# Patient Record
Sex: Female | Born: 1955
Health system: Southern US, Community
[De-identification: ages and names within clinical notes are randomized; demographics above are authoritative.]

## PROBLEM LIST (undated history)

## (undated) DIAGNOSIS — L03012 Cellulitis of left finger: Secondary | ICD-10-CM

## (undated) DIAGNOSIS — H3550 Unspecified hereditary retinal dystrophy: Secondary | ICD-10-CM

## (undated) DIAGNOSIS — M159 Polyosteoarthritis, unspecified: Secondary | ICD-10-CM

## (undated) DIAGNOSIS — K219 Gastro-esophageal reflux disease without esophagitis: Secondary | ICD-10-CM

## (undated) DIAGNOSIS — R490 Dysphonia: Secondary | ICD-10-CM

## (undated) HISTORY — DX: Cellulitis of left finger: L03.012

## (undated) HISTORY — DX: Gastro-esophageal reflux disease without esophagitis: K21.9

## (undated) HISTORY — DX: Polyosteoarthritis, unspecified: M15.9

## (undated) HISTORY — DX: Unspecified hereditary retinal dystrophy: H35.50

## (undated) HISTORY — DX: Dysphonia: R49.0

## (undated) NOTE — Progress Notes (Signed)
Formatting of this note might be different from the original.  Patient presents today for Prolia injection.    Electronically signed by Diona Foley, CMA at 10/21/2022  9:47 AM EST

---

## 1999-01-07 ENCOUNTER — Ambulatory Visit (HOSPITAL_COMMUNITY): Admission: RE | Admit: 1999-01-07 | Discharge: 1999-01-07 | Payer: Self-pay

## 1999-03-02 ENCOUNTER — Ambulatory Visit (HOSPITAL_COMMUNITY): Admission: RE | Admit: 1999-03-02 | Discharge: 1999-03-02 | Payer: Self-pay

## 1999-10-05 HISTORY — PX: ABDOMINAL HYSTERECTOMY: SHX81

## 2000-01-20 ENCOUNTER — Ambulatory Visit (HOSPITAL_COMMUNITY): Admission: RE | Admit: 2000-01-20 | Discharge: 2000-01-20 | Payer: Self-pay | Admitting: Specialist

## 2000-01-20 ENCOUNTER — Encounter: Payer: Self-pay | Admitting: Specialist

## 2001-01-23 ENCOUNTER — Encounter: Payer: Self-pay | Admitting: Specialist

## 2001-01-23 ENCOUNTER — Ambulatory Visit (HOSPITAL_COMMUNITY): Admission: RE | Admit: 2001-01-23 | Discharge: 2001-01-23 | Payer: Self-pay | Admitting: Specialist

## 2002-01-25 ENCOUNTER — Ambulatory Visit (HOSPITAL_COMMUNITY): Admission: RE | Admit: 2002-01-25 | Discharge: 2002-01-25 | Payer: Self-pay | Admitting: Specialist

## 2002-01-25 ENCOUNTER — Encounter: Payer: Self-pay | Admitting: Specialist

## 2004-03-26 ENCOUNTER — Ambulatory Visit (HOSPITAL_COMMUNITY): Admission: RE | Admit: 2004-03-26 | Discharge: 2004-03-26 | Payer: Self-pay | Admitting: Specialist

## 2005-06-01 ENCOUNTER — Ambulatory Visit (HOSPITAL_COMMUNITY): Admission: RE | Admit: 2005-06-01 | Discharge: 2005-06-01 | Payer: Self-pay | Admitting: Specialist

## 2006-05-19 ENCOUNTER — Ambulatory Visit (HOSPITAL_COMMUNITY): Admission: RE | Admit: 2006-05-19 | Discharge: 2006-05-19 | Payer: Self-pay | Admitting: Gastroenterology

## 2006-05-19 ENCOUNTER — Encounter (INDEPENDENT_AMBULATORY_CARE_PROVIDER_SITE_OTHER): Payer: Self-pay | Admitting: Specialist

## 2006-07-06 ENCOUNTER — Ambulatory Visit (HOSPITAL_COMMUNITY): Admission: RE | Admit: 2006-07-06 | Discharge: 2006-07-06 | Payer: Self-pay | Admitting: Specialist

## 2006-09-08 ENCOUNTER — Ambulatory Visit: Payer: Self-pay | Admitting: Internal Medicine

## 2006-09-08 LAB — CONVERTED CEMR LAB
ALT: 20 units/L (ref 0–40)
AST: 19 units/L (ref 0–37)
Albumin: 4.3 g/dL (ref 3.5–5.2)
Alkaline Phosphatase: 63 units/L (ref 39–117)
BUN: 11 mg/dL (ref 6–23)
Basophils Absolute: 0 10*3/uL (ref 0.0–0.1)
Basophils Relative: 0.6 % (ref 0.0–1.0)
CO2: 29 meq/L (ref 19–32)
Calcium: 9.6 mg/dL (ref 8.4–10.5)
Chloride: 104 meq/L (ref 96–112)
Chol/HDL Ratio, serum: 3.1
Cholesterol: 208 mg/dL (ref 0–200)
Creatinine, Ser: 0.8 mg/dL (ref 0.4–1.2)
Eosinophil percent: 5.1 % — ABNORMAL HIGH (ref 0.0–5.0)
GFR calc non Af Amer: 80 mL/min
Glomerular Filtration Rate, Af Am: 97 mL/min/{1.73_m2}
Glucose, Bld: 82 mg/dL (ref 70–99)
HCT: 42.6 % (ref 36.0–46.0)
HDL: 66.4 mg/dL (ref >39.0)
Hemoglobin: 14.4 g/dL (ref 12.0–15.0)
LDL DIRECT: 134.3 mg/dL
Lymphocytes Relative: 33.2 % (ref 12.0–46.0)
MCHC: 33.9 g/dL (ref 30.0–36.0)
MCV: 88.2 fL (ref 78.0–100.0)
Monocytes Absolute: 0.4 10*3/uL (ref 0.2–0.7)
Monocytes Relative: 6 % (ref 3.0–11.0)
Neutro Abs: 3.9 10*3/uL (ref 1.4–7.7)
Neutrophils Relative %: 55.1 % (ref 43.0–77.0)
Platelets: 303 10*3/uL (ref 150–400)
Potassium: 4.1 meq/L (ref 3.5–5.1)
RBC: 4.83 M/uL (ref 3.87–5.11)
RDW: 11.9 % (ref 11.5–14.6)
Sodium: 142 meq/L (ref 135–145)
TSH: 2.85 microintl units/mL (ref 0.35–5.50)
Total Bilirubin: 0.9 mg/dL (ref 0.3–1.2)
Total Protein: 7.4 g/dL (ref 6.0–8.3)
Triglyceride fasting, serum: 37 mg/dL (ref 0–149)
VLDL: 7 mg/dL (ref 0–40)
WBC: 7 10*3/uL (ref 4.5–10.5)

## 2006-09-28 ENCOUNTER — Ambulatory Visit: Payer: Self-pay | Admitting: Internal Medicine

## 2006-11-10 ENCOUNTER — Ambulatory Visit: Payer: Self-pay | Admitting: Cardiology

## 2007-07-03 ENCOUNTER — Ambulatory Visit: Payer: Self-pay | Admitting: Internal Medicine

## 2007-08-11 ENCOUNTER — Ambulatory Visit (HOSPITAL_COMMUNITY): Admission: RE | Admit: 2007-08-11 | Discharge: 2007-08-11 | Payer: Self-pay | Admitting: Specialist

## 2008-01-10 ENCOUNTER — Ambulatory Visit: Payer: Self-pay | Admitting: Internal Medicine

## 2008-06-18 ENCOUNTER — Telehealth: Payer: Self-pay | Admitting: Internal Medicine

## 2008-06-19 ENCOUNTER — Encounter: Payer: Self-pay | Admitting: Internal Medicine

## 2008-11-29 ENCOUNTER — Emergency Department (HOSPITAL_COMMUNITY): Admission: EM | Admit: 2008-11-29 | Discharge: 2008-11-29 | Payer: Self-pay | Admitting: Emergency Medicine

## 2010-03-05 ENCOUNTER — Telehealth: Payer: Self-pay | Admitting: Internal Medicine

## 2010-03-06 ENCOUNTER — Ambulatory Visit: Payer: Self-pay | Admitting: Internal Medicine

## 2010-03-12 ENCOUNTER — Telehealth: Payer: Self-pay | Admitting: Internal Medicine

## 2010-06-02 ENCOUNTER — Ambulatory Visit (HOSPITAL_COMMUNITY)
Admission: RE | Admit: 2010-06-02 | Discharge: 2010-06-02 | Payer: Self-pay | Source: Home / Self Care | Admitting: Specialist

## 2010-10-09 ENCOUNTER — Ambulatory Visit: Payer: Self-pay | Admitting: Internal Medicine

## 2010-10-09 ENCOUNTER — Encounter: Payer: Self-pay | Admitting: Internal Medicine

## 2010-10-09 ENCOUNTER — Ambulatory Visit
Admission: RE | Admit: 2010-10-09 | Discharge: 2010-10-09 | Payer: Self-pay | Source: Home / Self Care | Attending: Internal Medicine | Admitting: Internal Medicine

## 2010-10-09 ENCOUNTER — Other Ambulatory Visit: Payer: Self-pay | Admitting: Internal Medicine

## 2010-10-09 LAB — CBC WITH DIFFERENTIAL/PLATELET
Basophils Absolute: 0 10*3/uL (ref 0.0–0.1)
Basophils Relative: 0.5 % (ref 0.0–3.0)
Eosinophils Absolute: 0.2 10*3/uL (ref 0.0–0.7)
Eosinophils Relative: 2.1 % (ref 0.0–5.0)
HCT: 41.8 % (ref 36.0–46.0)
Hemoglobin: 14.3 g/dL (ref 12.0–15.0)
Lymphocytes Relative: 31.8 % (ref 12.0–46.0)
Lymphs Abs: 2.4 10*3/uL (ref 0.7–4.0)
MCHC: 34.2 g/dL (ref 30.0–36.0)
MCV: 88.4 fl (ref 78.0–100.0)
Monocytes Absolute: 0.5 10*3/uL (ref 0.1–1.0)
Monocytes Relative: 6.3 % (ref 3.0–12.0)
Neutro Abs: 4.5 10*3/uL (ref 1.4–7.7)
Neutrophils Relative %: 59.3 % (ref 43.0–77.0)
Platelets: 328 10*3/uL (ref 150.0–400.0)
RBC: 4.73 Mil/uL (ref 3.87–5.11)
RDW: 13.1 % (ref 11.5–14.6)
WBC: 7.6 10*3/uL (ref 4.5–10.5)

## 2010-10-09 LAB — LIPID PANEL
Cholesterol: 197 mg/dL (ref 0–200)
HDL: 59.4 mg/dL (ref >39.00)
LDL Cholesterol: 128 mg/dL — ABNORMAL HIGH (ref 0–99)
Total CHOL/HDL Ratio: 3
Triglycerides: 50 mg/dL (ref 0.0–149.0)
VLDL: 10 mg/dL (ref 0.0–40.0)

## 2010-10-09 LAB — HEPATIC FUNCTION PANEL
ALT: 17 U/L (ref 0–35)
AST: 14 U/L (ref 0–37)
Albumin: 3.8 g/dL (ref 3.5–5.2)
Alkaline Phosphatase: 63 U/L (ref 39–117)
Bilirubin, Direct: 0.1 mg/dL (ref 0.0–0.3)
Total Bilirubin: 0.9 mg/dL (ref 0.3–1.2)
Total Protein: 6.5 g/dL (ref 6.0–8.3)

## 2010-10-22 ENCOUNTER — Ambulatory Visit (HOSPITAL_COMMUNITY): Admission: RE | Admit: 2010-10-22 | Discharge: 2010-10-22 | Payer: Self-pay | Source: Home / Self Care

## 2010-10-25 ENCOUNTER — Encounter: Payer: Self-pay | Admitting: Internal Medicine

## 2010-10-26 LAB — SURGICAL PCR SCREEN
MRSA, PCR: NEGATIVE
Staphylococcus aureus: NEGATIVE

## 2010-11-03 NOTE — Assessment & Plan Note (Signed)
Summary: TICK BITES/SWORDS PT/PS   Vital Signs:  Patient profile:   55 year old female Weight:      134 pounds Temp:     97.5 degrees F oral BP sitting:   110 / 78  (left arm) Cuff size:   regular  Vitals Entered By: Duard Brady LPN (March 06, 6282 11:30 AM) CC: c/o 3 tick bites  2 in (l0 groin area , 1 on buttocks Is Patient Diabetic? No   CC:  c/o 3 tick bites  2 in (l0 groin area  and 1 on buttocks.  History of Present Illness: 55 year old patient who [place] 5 [place] and removed three ticks two days ago from the groin and buttock regions.  She has had no fever, headache, myalgias, or other constitutional complaints.  There has been no skin rash and the puncture sites are healing well where  the tics were extracted.  Allergies (verified): No Known Drug Allergies  Physical Exam  General:  Well-developed,well-nourished,in no acute distress; alert,appropriate and cooperative throughout examination Skin:  3 small approximately 2 mm shallow erythematous ulcers involving the left groin and buttock area   Impression & Recommendations:  Problem # 1:  TICK BITE (ICD-E906.4) the patient will be clinically observed at this time.  She was told this he develops fever or flulike illness or rash over the next 6 weeks to contact the office immediately for empiric antibiotic therapy  Complete Medication List: 1)  Calcium 500/d 500-200 Mg-unit Tabs (Calcium carbonate-vitamin d) .Marland Kitchen.. 1000 mg. q day  Patient Instructions: 1)  Please schedule a follow-up appointment as needed. 2)  call if you develop fever, skin rash, or a flulike illness

## 2010-11-03 NOTE — Progress Notes (Signed)
Summary: tick bite complications  Phone Note Call from Patient   Caller: Patient Call For: Birdie Sons MD Summary of Call: Pt has joint pain, and headache, and wants to be treated.  [phone]  Found one more tick.  Joints are swollen.  Has not taken temp  [facility] Initial call taken by: Lynann Beaver CMA,  March 12, 2010 4:00 PM  Follow-up for Phone Call        called and left message  Rx doxycycline 100  #30 one twice daily Follow-up by: Gordy Savers  MD,  March 12, 2010 5:03 PM    New/Updated Medications: DOXYCYCLINE HYCLATE 100 MG CAPS (DOXYCYCLINE HYCLATE) one by mouth two times a day Prescriptions: DOXYCYCLINE HYCLATE 100 MG CAPS (DOXYCYCLINE HYCLATE) one by mouth two times a day  #30 x 0   Entered by:   Lynann Beaver CMA   Authorized by:   Gordy Savers  MD   Signed by:   Lynann Beaver CMA on 03/12/2010   Method used:   Electronically to        [facility]. [phone]* (retail)       [address]       [place], [place]  [address]       Ph: [phone]       Fax: [phone]   RxID:   [id number] DOXYCYCLINE HYCLATE 100 MG CAPS (DOXYCYCLINE HYCLATE) one by mouth two times a day  #30 x 0   Entered by:   Lynann Beaver CMA   Authorized by:   Gordy Savers  MD   Signed by:   Lynann Beaver CMA on 03/12/2010   Method used:   Electronically to        [facility]. [phone]* (retail)       [phone]       [place], [place]  [address]       Ph: [phone]       Fax: [phone]   RxID:   [id number]

## 2010-11-03 NOTE — Progress Notes (Signed)
Summary: wants rx for abx  Phone Note Call from Patient Call back at [phone]   Caller: Patient----voice mail Summary of Call: Has been bitten by a deer tick in 3 places with the head enbedded in her. She get the tick to release and has swollen areas. Requesting to be treated for Lyme disease. wants doxycycline for 2 weeks or so. She had some old pills at home so she started them  herself.  Initial call taken by: Warnell Forester,  March 05, 2010 3:43 PM  Follow-up for Phone Call        fyi- hasn't been seen since 2009- tried to call and make ov,but no answer - the 2009 visit was also for a tick bite Follow-up by: Willy Eddy, LPN,  March 05, 1190 4:50 PM  Additional Follow-up for Phone Call Additional follow up Details #1::        none of this is necessary, if concerned schedule OV Additional Follow-up by: Birdie Sons MD,  March 06, 2010 10:12 AM    Additional Follow-up for Phone Call Additional follow up Details #2::    Set ov workin with Dr. Kirtland Bouchard. Follow-up by: Rudy Jew, RN,  March 06, 2010 10:20 AM

## 2010-11-05 NOTE — Assessment & Plan Note (Signed)
Summary: PRE-OP / SURG CLEARANCE // RS/PT [facility] FROM [facility]   Vital Signs:  Patient profile:   55 year old female Height:      67.5 inches Weight:      134 pounds BMI:     20.75 Temp:     98.4 degrees F oral Pulse rate:   64 / minute Pulse rhythm:   regular BP sitting:   104 / 80  (left arm) Cuff size:   regular  Vitals Entered By: Alfred Levins, CMA (October 09, 2010 12:10 PM)  Serial Vital Signs/Assessments:  Time      Position  BP       Pulse  Resp  Temp     By                     110/62                         Alfred Levins, CMA  CC: surgical clearance, cosmetic surgery in February   CC:  surgical clearance and cosmetic surgery in February.  Preventive Screening-Counseling & Management  Alcohol-Tobacco     Smoking Status: never  Caffeine-Diet-Exercise     Does Patient Exercise: yes      Drug Use:  no.    Current Medications (verified): 1)  Calcium 500/d 500-200 Mg-Unit  Tabs (Calcium Carbonate-Vitamin D) .Marland Kitchen.. 1000 Mg. Q Day 2)  Fish Oil  Oil (Fish Oil) .... Once Daily 3)  Preserve Vision .... Once Daily  Allergies (verified): No Known Drug Allergies  Past History:  Past Medical History: Unremarkable  Past Surgical History: Hysterectomy 2001  Family History: Family History of Colon CA 1st degree relative <60 Family History Hypertension mother carotid cancer, hypertension, spinal stenosis, emphysema still alive Father deceased (diving accident) Paternal Grandfather colon cancer 2 sisters healthy  Social History: Married Never Smoked Alcohol use-no Drug use-no Regular exercise-yes Smoking Status:  never Drug Use:  no Does Patient Exercise:  yes  Physical Exam  General:   well-developed well-nourished female in no acute distress. HEENT exam atraumatic, normocephalic. Extraocular muscles are intact. Neck is supple without lymphadenopathy or thyromegaly. Chest clear to auscultation cardiac exam S1-S2 are regular. Abdominal exam abdomen is soft and  nontender. Extremities no edema. Neurologic exam alert without any motor or sensory deficits.   Impression & Recommendations:  Problem # 1:  PREOPERATIVE EXAMINATION (ICD-V72.84)  patient is here for preoperative clearance. She has no significant medical problems. I think the risk islower thanr age-matched controls. Her EKG is unremarkable. We'll obtain lab work. Orders: Venipuncture ([address]) Specimen Handling (60454) TLB-CBC Platelet - w/Differential (85025-CBCD) TLB-Lipid Panel (80061-LIPID) TLB-Hepatic/Liver Function Pnl (80076-HEPATIC)  Complete Medication List: 1)  Calcium 500/d 500-200 Mg-unit Tabs (Calcium carbonate-vitamin d) .Marland Kitchen.. 1000 mg. q day 2)  Fish Oil Oil (Fish oil) .... Once daily 3)  Preserve Vision  .... Once daily   Orders Added: 1)  Venipuncture [09811] 2)  Specimen Handling [99000] 3)  TLB-CBC Platelet - w/Differential [85025-CBCD] 4)  TLB-Lipid Panel [80061-LIPID] 5)  TLB-Hepatic/Liver Function Pnl [80076-HEPATIC] 6)  Est. Patient Level III [91478]

## 2011-01-05 ENCOUNTER — Telehealth: Payer: Self-pay | Admitting: *Deleted

## 2011-01-05 MED ORDER — DOXYCYCLINE HYCLATE 100 MG PO TABS: 100.0000 mg | ORAL_TABLET | Freq: Two times a day (BID) | ORAL | Status: AC

## 2011-01-05 NOTE — Telephone Encounter (Signed)
Pt has 3 deer tick bites, and would like a prescription for Doxycycline called to [facility]

## 2011-01-05 NOTE — Telephone Encounter (Signed)
Ok Doxycycline 100 mg po bid for 7 days

## 2011-01-05 NOTE — Telephone Encounter (Signed)
Pt.notified

## 2011-01-19 LAB — POCT URINALYSIS DIP (DEVICE)
Bilirubin Urine: NEGATIVE
Glucose, UA: NEGATIVE mg/dL
Ketones, ur: NEGATIVE mg/dL
Nitrite: NEGATIVE
Protein, ur: NEGATIVE mg/dL
Urobilinogen, UA: 0.2 mg/dL (ref 0.0–1.0)

## 2011-01-19 LAB — CBC
MCHC: 34.4 g/dL (ref 30.0–36.0)
MCV: 88.3 fL (ref 78.0–100.0)
Platelets: 225 10*3/uL (ref 150–400)
RBC: 4.53 MIL/uL (ref 3.87–5.11)
WBC: 6 10*3/uL (ref 4.0–10.5)

## 2011-01-19 LAB — DIFFERENTIAL
Eosinophils Absolute: 0.3 10*3/uL (ref 0.0–0.7)
Lymphs Abs: 2.4 10*3/uL (ref 0.7–4.0)
Neutro Abs: 2.7 10*3/uL (ref 1.7–7.7)

## 2011-02-19 NOTE — Op Note (Signed)
Robin Henry, Robin Henry                 ACCOUNT NO.:  [id number]   MEDICAL RECORD NO.:  [id number]          PATIENT TYPE:  AMB   LOCATION:  ENDO                         FACILITY:  [facility]   PHYSICIAN:  Petra Kuba, M.D.    DATE OF BIRTH:  04/04/1956   DATE OF PROCEDURE:  05/19/2006  DATE OF DISCHARGE:                                 OPERATIVE REPORT   PROCEDURE PERFORMED:  Esophagogastroduodenoscopy with biopsy.   ENDOSCOPIST:  Petra Kuba, M.D.   INDICATIONS FOR PROCEDURE:  Upper tract symptoms.   Consent was signed after the risks, benefits, methods and options were  thoroughly discussed before any premeds given with my nurse in the office.   MEDICINES USED:  Per anesthesia, Diprivan, fentanyl and Versed per patient  preference.   DESCRIPTION OF PROCEDURE:  The video endoscope was inserted by direct  vision.  The esophagus was normal.  She did have a tiny hiatal hernia.  Scope passed into the stomach and advanced through a normal antrum, normal  pylorus into a normal duodenal bulb and around the C-loop to a normal second  portion of the duodenum.  The scope was withdrawn back to the bulb and a  good look there confirmed its normal appearance.  Scope was withdrawn back  to the stomach and retroflexed.  The angularis, cardia and fundus, lesser  and greater curve were normal on retroflex visualization.  Straight  visualization of the stomach did not reveal any additional findings.  The  scope was reinserted through the pylorus and into the bulb and into the  second portion of the duodenum just to double check and no additional  findings were seen.  Scope was withdrawn back to the stomach, two biopsies  of the antrum, two of the proximal stomach were obtained to rule out  Helicobacter pylori.  Air was suctioned, scope was slowly withdrawn.  Again,  a good look at the esophagus was normal.  A good look at both vocal cords  were normal.  Scope was removed.  The patient tolerated the  procedure well.  There were no obvious immediate complication.   ENDOSCOPIC DIAGNOSIS:  1. Tiny hiatal hernia with normal-appearing esophagus.  2. Otherwise within normal limits esophagogastroduodenoscopy status post      stomach biopsies to rule out Helicobacter pylori.   PLAN:  Await pathology.  Follow-up p.r.n.  Pump inhibitors, H2 blockers or  antacids p.r.n.           ______________________________  Petra Kuba, M.D.     MEM/MEDQ  D:  05/19/2006  T:  05/19/2006  Job:  [id number]   cc:   Valetta Mole. Swords, MD  [address] [place]  [place] [address]

## 2011-02-19 NOTE — Op Note (Signed)
Robin Henry, Robin Henry                 ACCOUNT NO.:  [id number]   MEDICAL RECORD NO.:  [id number]          PATIENT TYPE:  AMB   LOCATION:  ENDO                         FACILITY:  [facility]   PHYSICIAN:  Petra Kuba, M.D.    DATE OF BIRTH:  1955/10/26   DATE OF PROCEDURE:  05/19/2006  DATE OF DISCHARGE:                                 OPERATIVE REPORT   PROCEDURE:  Colonoscopy.   INDICATIONS FOR PROCEDURE:  Family history of colon cancer in a grandparent  at a young age.  Due for colonic screening.  Informed consent was signed  after risks, benefits, methods, and options were thoroughly discussed prior  to any premedications given and with my nurse in the office.   MEDICATIONS USED:  Per anesthesia, Diprivan, Fentanyl, and Versed per  patient choice.   DESCRIPTION OF PROCEDURE:  Rectal inspection was pertinent for small  external hemorrhoids.  Digital exam was negative.  The video pediatric  adjustable colonoscope was inserted and displayed a somewhat tortuous  looping colon.  With some abdominal pressure, was able to be advanced to the  cecum.  No abnormalities were seen on insertion.  The cecum was identified  by the appendiceal orifice and the ileocecal valve.  The prep was adequate.  There was some liquid stool that required washing and suctioning.  On slow  withdrawal through the colon, no abnormalities were seen.  When we fell back  around a loop, we did readvance around the curve to decrease chances of  missing things.  Once back in the rectum, anorectal pull-through and  retroflexion confirmed some small hemorrhoids.  The scope was straightened  and readvanced a short ways up the left side of the colon.  Air was  suctioned and the scope removed.   The patient tolerated the procedure well.  There were no obvious immediate  complications.   ENDOSCOPIC DIAGNOSES:  1. Internal and external small hemorrhoids.  2. Somewhat tortuous long looping colon.  3. Otherwise within normal  limits to the cecum.   PLAN:  Recheck colon screening in five years.  Happy to see back p.r.n.  Continue workup with an EGD.           ______________________________  Petra Kuba, M.D.     MEM/MEDQ  D:  05/19/2006  T:  05/19/2006  Job:  [id number]   cc:   Valetta Mole. Swords, MD  [address] [place]  [place] [address]

## 2011-05-12 ENCOUNTER — Other Ambulatory Visit: Payer: Self-pay | Admitting: Internal Medicine

## 2011-05-12 DIAGNOSIS — M8580 Other specified disorders of bone density and structure, unspecified site: Secondary | ICD-10-CM

## 2011-05-12 DIAGNOSIS — Z1231 Encounter for screening mammogram for malignant neoplasm of breast: Secondary | ICD-10-CM

## 2011-06-08 ENCOUNTER — Ambulatory Visit (HOSPITAL_COMMUNITY)
Admission: RE | Admit: 2011-06-08 | Discharge: 2011-06-08 | Disposition: A | Discharge status: Home / Self Care | Payer: 59 | Source: Ambulatory Visit | Attending: Internal Medicine | Admitting: Internal Medicine

## 2011-06-08 ENCOUNTER — Other Ambulatory Visit (HOSPITAL_COMMUNITY): Payer: Self-pay | Admitting: Specialist

## 2011-06-08 DIAGNOSIS — M8580 Other specified disorders of bone density and structure, unspecified site: Secondary | ICD-10-CM

## 2011-06-08 DIAGNOSIS — Z1231 Encounter for screening mammogram for malignant neoplasm of breast: Secondary | ICD-10-CM | POA: Insufficient documentation

## 2011-06-22 ENCOUNTER — Encounter (INDEPENDENT_AMBULATORY_CARE_PROVIDER_SITE_OTHER): Payer: Self-pay | Admitting: Ophthalmology

## 2012-01-10 ENCOUNTER — Telehealth: Payer: Self-pay | Admitting: Internal Medicine

## 2012-01-10 NOTE — Telephone Encounter (Signed)
Pt was bit by ticks this weekend and is not having joint pain and would like an rx for Doxycycline call in to [facility]. Pt said you can call her back and leave a detailed message on cell phone

## 2012-01-10 NOTE — Telephone Encounter (Signed)
Not necessary unless she develops a rash or infection around the site

## 2012-01-10 NOTE — Telephone Encounter (Signed)
Arline Asp, can you take a look at this please? Thanks.

## 2012-01-10 NOTE — Telephone Encounter (Signed)
Left message for pt to call back  °

## 2012-01-10 NOTE — Telephone Encounter (Signed)
Correction pt is having joint pain. Arline Asp please call pt today at [phone]

## 2012-01-11 MED ORDER — DOXYCYCLINE HYCLATE 100 MG PO TABS: 100.0000 mg | ORAL_TABLET | Freq: Two times a day (BID) | ORAL | Status: AC

## 2012-01-11 NOTE — Telephone Encounter (Signed)
Per pt she has a rash on back and 4 bites.  Per Dr Lovell Sheehan ok to call in doxycycline 100 mg bid #42, called into [facility] per pt request

## 2012-01-11 NOTE — Telephone Encounter (Signed)
Left message for pt to call back  °

## 2012-06-12 ENCOUNTER — Encounter: Payer: Self-pay | Admitting: Internal Medicine

## 2012-06-12 ENCOUNTER — Ambulatory Visit (INDEPENDENT_AMBULATORY_CARE_PROVIDER_SITE_OTHER): Payer: 59 | Admitting: Internal Medicine

## 2012-06-12 VITALS — BP 104/80 | HR 54 | Temp 97.6°F | Wt 135.0 lb

## 2012-06-12 DIAGNOSIS — Z0000 Encounter for general adult medical examination without abnormal findings: Secondary | ICD-10-CM

## 2012-06-12 LAB — BASIC METABOLIC PANEL
BUN: 14 mg/dL (ref 6–23)
Calcium: 9.4 mg/dL (ref 8.4–10.5)
Chloride: 106 mEq/L (ref 96–112)
Creatinine, Ser: 0.6 mg/dL (ref 0.4–1.2)
GFR: 107.32 mL/min (ref >60.00)
Glucose, Bld: 97 mg/dL (ref 70–99)

## 2012-06-12 LAB — LIPID PANEL
HDL: 72.4 mg/dL (ref >39.00)
LDL Cholesterol: 110 mg/dL — ABNORMAL HIGH (ref 0–99)
Total CHOL/HDL Ratio: 3
VLDL: 8.4 mg/dL (ref 0.0–40.0)

## 2012-06-12 LAB — POCT URINALYSIS DIPSTICK
Bilirubin, UA: NEGATIVE
Leukocytes, UA: NEGATIVE
Urobilinogen, UA: 0.2
pH, UA: 5.5

## 2012-06-12 LAB — HEPATIC FUNCTION PANEL
AST: 18 U/L (ref 0–37)
Total Protein: 7 g/dL (ref 6.0–8.3)

## 2012-06-12 LAB — CBC WITH DIFFERENTIAL/PLATELET
Basophils Absolute: 0.1 10*3/uL (ref 0.0–0.1)
Basophils Relative: 0.8 % (ref 0.0–3.0)
HCT: 41 % (ref 36.0–46.0)
Lymphocytes Relative: 27.7 % (ref 12.0–46.0)
Lymphs Abs: 1.8 10*3/uL (ref 0.7–4.0)
MCV: 89.7 fl (ref 78.0–100.0)
Neutro Abs: 4.1 10*3/uL (ref 1.4–7.7)
Neutrophils Relative %: 63.4 % (ref 43.0–77.0)

## 2012-06-12 NOTE — Progress Notes (Signed)
Patient ID: Robin Henry, female   DOB: [birth year], 56 y.o.   MRN: [id number]  CPX  No past medical history on file.  History   Social History  . Marital Status: Married    Spouse Name: N/A    Number of Children: N/A  . Years of Education: N/A   Occupational History  . Not on file.   Social History Main Topics  . Smoking status: Never Smoker   . Smokeless tobacco: Not on file  . Alcohol Use: No  . Drug Use: No  . Sexually Active:    Other Topics Concern  . Not on file   Social History Narrative  . No narrative on file    Past Surgical History  Procedure Date  . Abdominal hysterectomy     Family History  Problem Relation Age of Onset  . Hypertension Mother   . Emphysema Mother   . Cancer Mother     cartoid  . Colon cancer Paternal Grandfather     No Known Allergies  Current Outpatient Prescriptions on File Prior to Visit  Medication Sig Dispense Refill  . calcium-vitamin D 500 MG tablet Take 1 tablet by mouth daily.        . fish oil-omega-3 fatty acids 1000 MG capsule Take 2 g by mouth daily.        . Multiple Vitamins-Minerals (VISION-VITE PRESERVE PO) Take by mouth daily.           patient denies chest pain, shortness of breath, orthopnea. Denies lower extremity edema, abdominal pain, change in appetite, change in bowel movements. Patient denies rashes, musculoskeletal complaints. No other specific complaints in a complete review of systems.   BP 104/80  Pulse 54  Temp 97.6 F (36.4 C) (Oral)  Wt 135 lb (61.236 kg)  Well-developed well-nourished female in no acute distress. HEENT exam atraumatic, normocephalic, extraocular muscles are intact. Neck is supple. No jugular venous distention no thyromegaly. Chest clear to auscultation without increased work of breathing. Cardiac exam S1 and S2 are regular. Abdominal exam active bowel sounds, soft, nontender. Extremities no edema. Neurologic exam she is alert without any motor sensory deficits. Gait is  normal.   A/P-- well visit - helath maintenance UTD

## 2012-06-28 ENCOUNTER — Other Ambulatory Visit (HOSPITAL_COMMUNITY): Payer: Self-pay | Admitting: Specialist

## 2012-06-28 DIAGNOSIS — Z1231 Encounter for screening mammogram for malignant neoplasm of breast: Secondary | ICD-10-CM

## 2012-07-10 ENCOUNTER — Ambulatory Visit (HOSPITAL_COMMUNITY)
Admission: RE | Admit: 2012-07-10 | Discharge: 2012-07-10 | Disposition: A | Discharge status: Home / Self Care | Payer: 59 | Source: Ambulatory Visit | Attending: Specialist | Admitting: Specialist

## 2012-07-10 DIAGNOSIS — Z1231 Encounter for screening mammogram for malignant neoplasm of breast: Secondary | ICD-10-CM | POA: Insufficient documentation

## 2012-09-08 ENCOUNTER — Encounter (HOSPITAL_COMMUNITY): Payer: Self-pay

## 2012-09-08 ENCOUNTER — Encounter (HOSPITAL_COMMUNITY)
Admission: RE | Disposition: A | Discharge status: Home / Self Care | Payer: Self-pay | Source: Ambulatory Visit | Attending: Gastroenterology

## 2012-09-08 ENCOUNTER — Ambulatory Visit (HOSPITAL_COMMUNITY)
Admission: RE | Admit: 2012-09-08 | Discharge: 2012-09-08 | Disposition: A | Discharge status: Home / Self Care | Payer: 59 | Source: Ambulatory Visit | Attending: Gastroenterology | Admitting: Gastroenterology

## 2012-09-08 DIAGNOSIS — Z1211 Encounter for screening for malignant neoplasm of colon: Secondary | ICD-10-CM | POA: Insufficient documentation

## 2012-09-08 DIAGNOSIS — K648 Other hemorrhoids: Secondary | ICD-10-CM | POA: Insufficient documentation

## 2012-09-08 DIAGNOSIS — Z800 Family history of malignant neoplasm of digestive organs: Secondary | ICD-10-CM | POA: Insufficient documentation

## 2012-09-08 DIAGNOSIS — K644 Residual hemorrhoidal skin tags: Secondary | ICD-10-CM | POA: Insufficient documentation

## 2012-09-08 DIAGNOSIS — K6389 Other specified diseases of intestine: Secondary | ICD-10-CM | POA: Insufficient documentation

## 2012-09-08 HISTORY — PX: COLONOSCOPY: SHX5424

## 2012-09-08 SURGERY — COLONOSCOPY

## 2012-09-08 MED ORDER — MIDAZOLAM HCL 10 MG/2ML IJ SOLN
INTRAMUSCULAR | Status: AC
  Filled 2012-09-08: qty 2

## 2012-09-08 MED ORDER — SODIUM CHLORIDE 0.9 % IV SOLN: INTRAVENOUS | Status: DC

## 2012-09-08 MED ORDER — FENTANYL CITRATE 0.05 MG/ML IJ SOLN
INTRAMUSCULAR | Status: AC
  Filled 2012-09-08: qty 2

## 2012-09-08 NOTE — Progress Notes (Signed)
Robin Henry 11:11 AM  Subjective: Patient without any GI problems except occasional mucoid bowel and her history was reviewed and no different from when she was in our office and no new meds  Objective: Vital signs stable afebrile no acute distress exam please see pre-assessment evaluation  Assessment: Family history of colon cancer  Plan: Okay to proceed with colonoscopy  [facility]

## 2012-09-08 NOTE — Op Note (Signed)
[facility] [address] [place], [address]   COLONOSCOPY PROCEDURE REPORT  PATIENT: Robin Henry, Robin Henry  MR#: [id number] BIRTHDATE: May 18, 1956 , 56  yrs. old GENDER: Female ENDOSCOPIST: Vida Rigger, MD REFERRED XB:JYNWG Swords, M.D. PROCEDURE DATE:  09/08/2012 PROCEDURE:   Colonoscopy, screening ASA CLASS:   Class I INDICATIONS:Colorectal cancer screening.  family history of colon cancer in a grandparent MEDICATIONS: None  DESCRIPTION OF PROCEDURE:   After the risks benefits and alternatives of the procedure were thoroughly explained, informed consent was obtained.  The Pentax Ped Colon [id number]  endoscope was introduced through the anus and advanced to the terminal ileum which was intubated for a short distance , limited by No adverse events experienced.   The quality of the prep was adequate. .  The instrument was then slowly withdrawn as the colon was fully examined. to advanced to the cecum required abdominal pressure but no position changes and no abnormalities was seen on insertion . The scope was inserted a short ways into the terminal ileum and a few erosions were seen without surrounding inflammation in the [place]. The scope was slowly withdrawn the colon was completely normal once back in the rectum and anal rectal pull-through and retroflexion confirmed some small hemorrhoids the scope was straightened readvanced a  short ways up the left side of the colon air was suctioned scope removed patient tolerated the procedure well there was no obvious immediate complication      FINDINGS:  1 internal/external small hemorrhoids 2. Few terminal ileum erosions without surrounding inflammation 3. Otherwise within normal limits  colonoscopy  COMPLICATIONS:none  IMPRESSION:  above  RECOMMENDATIONS: recheck colon screening in 5 years happy to see back sooner when necessary and we discussed doubtful Crohn's disease versus nonsteroidal-induced with her mother  with a history of colitis happy to see back if symptoms increase   _______________________________ eSigned:  Vida Rigger, MD 09/08/2012 11:53 AM   NF:AOZHY Romilda Garret, MD

## 2012-09-11 ENCOUNTER — Encounter (HOSPITAL_COMMUNITY): Payer: Self-pay | Admitting: Gastroenterology

## 2013-05-10 ENCOUNTER — Ambulatory Visit: Payer: 59

## 2013-07-02 ENCOUNTER — Other Ambulatory Visit (HOSPITAL_COMMUNITY): Payer: Self-pay | Admitting: Specialist

## 2013-07-02 DIAGNOSIS — Z1231 Encounter for screening mammogram for malignant neoplasm of breast: Secondary | ICD-10-CM

## 2013-07-19 ENCOUNTER — Ambulatory Visit (HOSPITAL_COMMUNITY)
Admission: RE | Admit: 2013-07-19 | Discharge: 2013-07-19 | Disposition: A | Discharge status: Home / Self Care | Payer: 59 | Source: Ambulatory Visit | Attending: Specialist | Admitting: Specialist

## 2013-07-19 DIAGNOSIS — Z1231 Encounter for screening mammogram for malignant neoplasm of breast: Secondary | ICD-10-CM | POA: Insufficient documentation

## 2013-07-25 ENCOUNTER — Other Ambulatory Visit: Payer: Self-pay | Admitting: Specialist

## 2013-07-25 DIAGNOSIS — R928 Other abnormal and inconclusive findings on diagnostic imaging of breast: Secondary | ICD-10-CM

## 2013-08-24 ENCOUNTER — Ambulatory Visit
Admission: RE | Admit: 2013-08-24 | Discharge: 2013-08-24 | Disposition: A | Discharge status: Home / Self Care | Payer: 59 | Source: Ambulatory Visit | Attending: Specialist | Admitting: Specialist

## 2013-08-24 DIAGNOSIS — R928 Other abnormal and inconclusive findings on diagnostic imaging of breast: Secondary | ICD-10-CM

## 2013-09-12 ENCOUNTER — Other Ambulatory Visit (INDEPENDENT_AMBULATORY_CARE_PROVIDER_SITE_OTHER): Payer: 59

## 2013-09-12 DIAGNOSIS — Z0000 Encounter for general adult medical examination without abnormal findings: Secondary | ICD-10-CM

## 2013-09-12 LAB — POCT URINALYSIS DIPSTICK
Bilirubin, UA: NEGATIVE
Ketones, UA: NEGATIVE
Leukocytes, UA: NEGATIVE
Nitrite, UA: NEGATIVE

## 2013-09-12 LAB — BASIC METABOLIC PANEL
CO2: 27 mEq/L (ref 19–32)
Chloride: 106 mEq/L (ref 96–112)
Creatinine, Ser: 0.7 mg/dL (ref 0.4–1.2)
Sodium: 143 mEq/L (ref 135–145)

## 2013-09-12 LAB — CBC WITH DIFFERENTIAL/PLATELET
Basophils Relative: 0.9 % (ref 0.0–3.0)
Eosinophils Absolute: 0.2 10*3/uL (ref 0.0–0.7)
HCT: 41.3 % (ref 36.0–46.0)
Hemoglobin: 13.6 g/dL (ref 12.0–15.0)
Lymphs Abs: 1.8 10*3/uL (ref 0.7–4.0)
MCHC: 33 g/dL (ref 30.0–36.0)
MCV: 87.8 fl (ref 78.0–100.0)
Monocytes Absolute: 0.4 10*3/uL (ref 0.1–1.0)
Neutro Abs: 3.4 10*3/uL (ref 1.4–7.7)
RBC: 4.71 Mil/uL (ref 3.87–5.11)

## 2013-09-12 LAB — HEPATIC FUNCTION PANEL
Albumin: 4.2 g/dL (ref 3.5–5.2)
Alkaline Phosphatase: 61 U/L (ref 39–117)
Bilirubin, Direct: 0.1 mg/dL (ref 0.0–0.3)

## 2013-09-12 LAB — TSH: TSH: 2.1 u[IU]/mL (ref 0.35–5.50)

## 2013-09-18 ENCOUNTER — Ambulatory Visit (INDEPENDENT_AMBULATORY_CARE_PROVIDER_SITE_OTHER): Payer: 59 | Admitting: Family

## 2013-09-18 ENCOUNTER — Encounter: Payer: Self-pay | Admitting: Family

## 2013-09-18 VITALS — BP 102/70 | HR 67 | Ht 66.5 in | Wt 135.4 lb

## 2013-09-18 DIAGNOSIS — Z0000 Encounter for general adult medical examination without abnormal findings: Secondary | ICD-10-CM

## 2013-09-18 NOTE — Progress Notes (Signed)
Subjective:    Patient ID: Robin Henry, female    DOB: May 03, 1956, 57 y.o.   MRN: [id number]  HPI 57 year old, nonsmoker, is in today for a routine physical examination for this healthy  Female. Reviewed all health maintenance protocols including mammography colonoscopy bone density and reviewed appropriate screening labs. Her immunization history was reviewed as well as her current medications and allergies refills of her chronic medications were given and the plan for yearly health maintenance was discussed all orders and referrals were made as appropriate.   Review of Systems  Constitutional: Negative.   HENT: Negative.   Eyes: Negative.   Respiratory: Negative.   Cardiovascular: Negative.   Gastrointestinal: Negative.   Endocrine: Negative.   Genitourinary: Negative.   Musculoskeletal: Negative.   Skin: Negative.   Allergic/Immunologic: Negative.   Neurological: Negative.   Hematological: Negative.   Psychiatric/Behavioral: Negative.    History reviewed. No pertinent past medical history.  History   Social History  . Marital Status: Single    Spouse Name: N/A    Number of Children: N/A  . Years of Education: N/A   Occupational History  . Not on file.   Social History Main Topics  . Smoking status: Never Smoker   . Smokeless tobacco: Not on file  . Alcohol Use: No  . Drug Use: No  . Sexual Activity:    Other Topics Concern  . Not on file   Social History Narrative  . No narrative on file    Past Surgical History  Procedure Laterality Date  . Abdominal hysterectomy    . Colonoscopy  09/08/2012    Procedure: COLONOSCOPY;  Surgeon: Petra Kuba, MD;  Location: [facility] ENDOSCOPY;  Service: Endoscopy;  Laterality: N/A;  wants no sedation-but start iv    Family History  Problem Relation Age of Onset  . Hypertension Mother   . Emphysema Mother   . Cancer Mother     cartoid  . Colon cancer Paternal Grandfather     No Known Allergies  Current Outpatient  Prescriptions on File Prior to Visit  Medication Sig Dispense Refill  . calcium-vitamin D 500 MG tablet Take 1 tablet by mouth daily.        . fish oil-omega-3 fatty acids 1000 MG capsule Take 2 g by mouth daily.        . Multiple Vitamins-Minerals (VISION-VITE PRESERVE PO) Take by mouth daily.         No current facility-administered medications on file prior to visit.    BP 102/70  Pulse 67  Ht 5' 6.5" (1.689 m)  Wt 135 lb 6.4 oz (61.417 kg)  BMI 21.53 kg/m2chart    Objective:   Physical Exam  Constitutional: She is oriented to person, place, and time. She appears well-developed and well-nourished.  HENT:  Head: Normocephalic.  Right Ear: External ear normal.  Left Ear: External ear normal.  Nose: Nose normal.  Mouth/Throat: Oropharynx is clear and moist.  Eyes: Conjunctivae and EOM are normal. Pupils are equal, round, and reactive to light.  Neck: Normal range of motion. Neck supple. No thyromegaly present.  Cardiovascular: Normal rate, regular rhythm and normal heart sounds.   Pulmonary/Chest: Effort normal and breath sounds normal.  Abdominal: Soft. Bowel sounds are normal. She exhibits no distension. There is no tenderness. There is no rebound.  Musculoskeletal: Normal range of motion. She exhibits no edema and no tenderness.  Neurological: She is alert and oriented to person, place, and time. She has  normal reflexes. She displays normal reflexes. No cranial nerve deficit. Coordination normal.  Skin: Skin is warm and dry.  Psychiatric: She has a normal mood and affect.          Assessment & Plan:  Assessment:  1. CPX  Plan: Encouraged healthy diet, exercise, monthly self breast exams. Encourage tetanus. Patient declines. Will return in a more convenient time for her to have a tetanus administered. Recheck in one year and sooner as needed.

## 2013-09-18 NOTE — Patient Instructions (Signed)

## 2014-05-09 ENCOUNTER — Telehealth: Payer: Self-pay | Admitting: Internal Medicine

## 2014-05-09 NOTE — Telephone Encounter (Signed)
Noted  

## 2014-05-09 NOTE — Telephone Encounter (Signed)
Patient Information:  Caller Name: Jnai  Phone: [phone]  Patient: Robin Henry  Gender: Female  DOB: 1955-11-10  Age: 58 Years  PCP: Phoebe Sharps (Adults only, leaving end of July 2015)  Office Follow Up:  Does the office need to follow up with this patient?: No  Instructions For The Office: N/A  RN Note:  Chigger Bites, onset 8-2.  Pt has severe itching w/ redness spreading around Bite areas.  All emergent sxs ruled out per Insect Bite protocol, see today d/t red or very tender area getting larger over 48 hrs after bite.  No same day appts w/ local [place] locations.  Pt doesn't wish to travel long distance, is currently at Urgent Care, will be evaluated now and will f/u w/ Office to set up well visit w/ a new MD, d/t Pt's PCP, Dr Leanne Chang.  Symptoms  Reason For Call & Symptoms: Chigger Bites, redness spreading, onset 8-2  Reviewed Health History In EMR: Yes  Reviewed Medications In EMR: Yes  Reviewed Allergies In EMR: Yes  Reviewed Surgeries / Procedures: Yes  Date of Onset of Symptoms: 05/05/2014  Treatments Tried: Hyrdocortizone 1%  Treatments Tried Worked: No  Guideline(s) Used:  Conservator, museum/gallery  Disposition Per Guideline:   See Today in Office  Reason For Disposition Reached:   Red or very tender (to touch) area, getting larger over 48 hours after the bite  Advice Given:  N/A  Patient Will Follow Care Advice:  YES

## 2015-03-28 ENCOUNTER — Telehealth: Payer: Self-pay | Admitting: Internal Medicine

## 2015-03-28 NOTE — Telephone Encounter (Signed)
Pt would like to est with dr Burnice Logan. Can I sch?

## 2015-03-28 NOTE — Telephone Encounter (Signed)
lmom for pt to call back

## 2015-03-28 NOTE — Telephone Encounter (Signed)
Dr. Raliegh Ip is not taking new patients, she will have to establish with another provider.

## 2015-03-31 ENCOUNTER — Other Ambulatory Visit (HOSPITAL_COMMUNITY): Payer: Self-pay | Admitting: Specialist

## 2015-03-31 DIAGNOSIS — Z1231 Encounter for screening mammogram for malignant neoplasm of breast: Secondary | ICD-10-CM

## 2015-04-02 NOTE — Telephone Encounter (Signed)
Pt aware of Dr Raliegh Ip 's decision. Advised pt [place] is near her home and she was very excited.  Transferred pt to [facility] office to est w/ Dr Anitra Lauth.

## 2015-04-02 NOTE — Telephone Encounter (Signed)
lmom for pt to call back

## 2015-04-11 ENCOUNTER — Ambulatory Visit (HOSPITAL_COMMUNITY)
Admission: RE | Admit: 2015-04-11 | Discharge: 2015-04-11 | Disposition: A | Discharge status: Home / Self Care | Payer: 59 | Source: Ambulatory Visit | Attending: Specialist | Admitting: Specialist

## 2015-04-11 DIAGNOSIS — Z1231 Encounter for screening mammogram for malignant neoplasm of breast: Secondary | ICD-10-CM | POA: Diagnosis not present

## 2015-04-25 ENCOUNTER — Ambulatory Visit (INDEPENDENT_AMBULATORY_CARE_PROVIDER_SITE_OTHER): Payer: 59 | Admitting: Family Medicine

## 2015-04-25 ENCOUNTER — Encounter: Payer: Self-pay | Admitting: Family Medicine

## 2015-04-25 VITALS — BP 114/81 | HR 69 | Temp 97.3°F | Resp 16 | Ht 67.0 in | Wt 134.0 lb

## 2015-04-25 DIAGNOSIS — Z23 Encounter for immunization: Secondary | ICD-10-CM

## 2015-04-25 DIAGNOSIS — Z0000 Encounter for general adult medical examination without abnormal findings: Secondary | ICD-10-CM

## 2015-04-25 DIAGNOSIS — E039 Hypothyroidism, unspecified: Secondary | ICD-10-CM

## 2015-04-25 DIAGNOSIS — E038 Other specified hypothyroidism: Secondary | ICD-10-CM

## 2015-04-25 LAB — COMPREHENSIVE METABOLIC PANEL
ALBUMIN: 4.4 g/dL (ref 3.5–5.2)
ALT: 19 U/L (ref 0–35)
AST: 16 U/L (ref 0–37)
Alkaline Phosphatase: 73 U/L (ref 39–117)
BILIRUBIN TOTAL: 0.9 mg/dL (ref 0.2–1.2)
BUN: 13 mg/dL (ref 6–23)
CALCIUM: 9.6 mg/dL (ref 8.4–10.5)
CHLORIDE: 104 meq/L (ref 96–112)
CO2: 30 mEq/L (ref 19–32)
CREATININE: 0.74 mg/dL (ref 0.40–1.20)
GFR: 85.03 mL/min (ref >60.00)
GLUCOSE: 77 mg/dL (ref 70–99)
Potassium: 4.5 mEq/L (ref 3.5–5.1)
SODIUM: 142 meq/L (ref 135–145)
TOTAL PROTEIN: 6.7 g/dL (ref 6.0–8.3)

## 2015-04-25 LAB — CBC WITH DIFFERENTIAL/PLATELET
BASOS ABS: 0 10*3/uL (ref 0.0–0.1)
Basophils Relative: 0.5 % (ref 0.0–3.0)
EOS ABS: 0.2 10*3/uL (ref 0.0–0.7)
EOS PCT: 2.6 % (ref 0.0–5.0)
HEMATOCRIT: 43.3 % (ref 36.0–46.0)
Hemoglobin: 14.2 g/dL (ref 12.0–15.0)
LYMPHS ABS: 1.8 10*3/uL (ref 0.7–4.0)
LYMPHS PCT: 26.8 % (ref 12.0–46.0)
MCHC: 32.8 g/dL (ref 30.0–36.0)
MCV: 87.3 fl (ref 78.0–100.0)
MONOS PCT: 6.2 % (ref 3.0–12.0)
Monocytes Absolute: 0.4 10*3/uL (ref 0.1–1.0)
NEUTROS ABS: 4.2 10*3/uL (ref 1.4–7.7)
Neutrophils Relative %: 63.9 % (ref 43.0–77.0)
PLATELETS: 277 10*3/uL (ref 150.0–400.0)
RBC: 4.96 Mil/uL (ref 3.87–5.11)
RDW: 13.6 % (ref 11.5–15.5)
WBC: 6.6 10*3/uL (ref 4.0–10.5)

## 2015-04-25 LAB — LIPID PANEL
CHOL/HDL RATIO: 3
Cholesterol: 210 mg/dL — ABNORMAL HIGH (ref 0–200)
HDL: 61.2 mg/dL (ref >39.00)
LDL CALC: 134 mg/dL — AB (ref 0–99)
NONHDL: 148.8
TRIGLYCERIDES: 76 mg/dL (ref 0.0–149.0)
VLDL: 15.2 mg/dL (ref 0.0–40.0)

## 2015-04-25 LAB — TSH: TSH: 2.56 u[IU]/mL (ref 0.35–4.50)

## 2015-04-25 MED ORDER — ZOSTER VACCINE LIVE 19400 UNT/0.65ML ~~LOC~~ SOLR: 0.6500 mL | Freq: Once | SUBCUTANEOUS | Status: DC

## 2015-04-25 NOTE — Progress Notes (Signed)
Office Note 04/26/2015  CC:  Chief Complaint  Patient presents with  . Establish Care    HPI:  Robin Henry is a 59 y.o. White female who is here to establish care. Patient's most recent primary MD: Dr. Leanne Chang (retired from clinical practice). Has a GYN MD and is getting ready to go see him soon. Old records in EPIC/HL were reviewed prior to or during today's visit.  Has had intermittent L LQ sharp pain that resolved in < 1 day. Some chronic joint aches, varies in location but most of the time it is her knees.  Does lots of sporting activities that put stress on joints.    Reports historically being in excellent health, no signif hx of problems at all.   Past Medical History  Diagnosis Date  . Osteoarthritis, multiple sites     Primarily knees  . Subclinical hypothyroidism     Past Surgical History  Procedure Laterality Date  . Abdominal hysterectomy  2001    fibroids  . Colonoscopy  09/08/2012    Normal.  Recall 10 yrs.  Procedure: COLONOSCOPY;  Surgeon: Jeryl Columbia, MD;  Location: [facility];  Service: Endoscopy;  Laterality: N/A;  wants no sedation-but start iv    Family History  Problem Relation Age of Onset  . Hypertension Mother   . Emphysema Mother   . Cancer Mother     cartoid  . Colon cancer Paternal Grandfather   . Alcohol abuse Sister   . Alcohol abuse Maternal Grandmother   . Lung cancer Maternal Grandmother     History   Social History  . Marital Status: Single    Spouse Name: N/A  . Number of Children: N/A  . Years of Education: N/A   Occupational History  . Not on file.   Social History Main Topics  . Smoking status: Never Smoker   . Smokeless tobacco: Not on file  . Alcohol Use: Yes     Comment: occasionally  . Drug Use: No  . Sexual Activity: Not on file   Other Topics Concern  . Not on file   Social History Narrative   Widow.   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 calcium-vitamin D 500 MG tablet Take 1 tablet by mouth daily.    . fish oil-omega-3 fatty acids 1000 MG capsule Take 2 g by mouth daily.    . Multiple Vitamins-Minerals (VISION-VITE PRESERVE PO) Take by mouth daily.    Marland Kitchen zoster vaccine live, PF, (ZOSTAVAX) 60630 UNT/0.65ML injection Inject 19,400 Units into the skin once.   No facility-administered encounter medications on file as of 04/25/2015.    No Known Allergies  ROS Review of Systems  Constitutional: Negative for fever, chills, appetite change and fatigue.  HENT: Negative for congestion, dental problem, ear pain and sore throat.   Eyes: Negative for discharge, redness and visual disturbance.  Respiratory: Negative for cough, chest tightness, shortness of breath and wheezing.   Cardiovascular: Negative for chest pain, palpitations and leg swelling.  Gastrointestinal: Negative for nausea, vomiting, abdominal pain, diarrhea and blood in stool.  Genitourinary: Negative for dysuria, urgency, frequency, hematuria, flank pain and difficulty urinating.  Musculoskeletal: Negative for myalgias, back pain, joint swelling, arthralgias and neck stiffness.  Skin: Negative for pallor and rash.  Neurological: Negative for dizziness, speech difficulty, weakness and headaches.  Hematological: Negative for adenopathy. Does not bruise/bleed easily.  Psychiatric/Behavioral: Negative for confusion and sleep disturbance. The patient is not nervous/anxious.     PE; Blood pressure 114/81, pulse 69, temperature 97.3 F (36.3 C), temperature source Oral, resp. rate 16, height 5\' 7"  (1.702 m), weight 134 lb (60.782 kg), SpO2 97 %.  Pt examined with Sharen Hones, CMA, as chaperone. Gen: Alert, well appearing.  Patient is oriented to person, place, time, and situation. AFFECT: pleasant, lucid thought and speech. ENT: Ears: EACs clear, normal epithelium.  TMs with good light reflex and landmarks bilaterally.  Eyes: no  injection, icteris, swelling, or exudate.  EOMI, PERRLA. Nose: no drainage or turbinate edema/swelling.  No injection or focal lesion.  Mouth: lips without lesion/swelling.  Oral mucosa pink and moist.  Dentition intact and without obvious caries or gingival swelling.  Oropharynx without erythema, exudate, or swelling.  Neck: supple/nontender.  No LAD, mass, or TM.  Carotid pulses 2+ bilaterally, without bruits. CV: RRR, no m/r/g.   LUNGS: CTA bilat, nonlabored resps, good aeration in all lung fields. ABD: soft, NT, ND, BS normal.  No hepatospenomegaly or mass.  No bruits. EXT: no clubbing, cyanosis, or edema.  Musculoskeletal: no joint swelling, erythema, warmth, or tenderness.  ROM of all joints intact. Skin - no sores or suspicious lesions or rashes or color changes  Pertinent labs:  none   ASSESSMENT AND PLAN:   Health maintenance exam: Reviewed age and gender appropriate health maintenance issues (prudent diet, regular exercise, health risks of tobacco and excessive alcohol, use of seatbelts, fire alarms in home, use of sunscreen).  Also reviewed age and gender appropriate health screening as well as vaccine recommendations. GYN: cerv and breast ca screening via GYN. HP labs done today, zostavax rx printed for pt.  An After Visit Summary was printed and given to the patient.   Return in about 1 year (around 04/24/2016) for annual CPE (fasting).

## 2015-04-25 NOTE — Progress Notes (Signed)
Pre visit review using our clinic review tool, if applicable. No additional management support is needed unless otherwise documented below in the visit note. 

## 2015-04-26 ENCOUNTER — Encounter: Payer: Self-pay | Admitting: Family Medicine

## 2015-10-28 ENCOUNTER — Telehealth: Payer: Self-pay | Admitting: *Deleted

## 2015-10-28 MED ORDER — ZOSTER VACCINE LIVE 19400 UNT/0.65ML ~~LOC~~ SOLR: 0.6500 mL | Freq: Once | SUBCUTANEOUS | Status: DC

## 2015-10-28 NOTE — Telephone Encounter (Signed)
Pt LMOM on 10/27/15 at 4:41pm wanting to p/u a Rx for the shingles vaccine. She stated that it is okay to leave detailed message on her cell vm. Please advise. Thank.

## 2015-10-28 NOTE — Telephone Encounter (Signed)
Zostavax rx printed.

## 2015-10-29 NOTE — Telephone Encounter (Signed)
Left message on cell vm stating Rx is ready for p/u.

## 2015-12-18 DIAGNOSIS — Z01419 Encounter for gynecological examination (general) (routine) without abnormal findings: Secondary | ICD-10-CM | POA: Diagnosis not present

## 2015-12-19 DIAGNOSIS — Z113 Encounter for screening for infections with a predominantly sexual mode of transmission: Secondary | ICD-10-CM | POA: Diagnosis not present

## 2015-12-19 DIAGNOSIS — M79674 Pain in right toe(s): Secondary | ICD-10-CM | POA: Diagnosis not present

## 2015-12-19 DIAGNOSIS — Z13220 Encounter for screening for lipoid disorders: Secondary | ICD-10-CM | POA: Diagnosis not present

## 2016-02-23 MED FILL — CIPROFLOXACIN HCL 500 MG TA: 500 | 7 days supply | Qty: 14 | Fill #0

## 2016-03-29 DIAGNOSIS — Z0100 Encounter for examination of eyes and vision without abnormal findings: Secondary | ICD-10-CM | POA: Diagnosis not present

## 2016-05-26 DIAGNOSIS — H3554 Dystrophies primarily involving the retinal pigment epithelium: Secondary | ICD-10-CM | POA: Diagnosis not present

## 2016-05-26 DIAGNOSIS — H43811 Vitreous degeneration, right eye: Secondary | ICD-10-CM | POA: Diagnosis not present

## 2016-05-26 DIAGNOSIS — D3132 Benign neoplasm of left choroid: Secondary | ICD-10-CM | POA: Diagnosis not present

## 2016-05-28 ENCOUNTER — Other Ambulatory Visit: Payer: Self-pay | Admitting: Family Medicine

## 2016-05-28 DIAGNOSIS — Z0000 Encounter for general adult medical examination without abnormal findings: Secondary | ICD-10-CM

## 2016-06-01 ENCOUNTER — Other Ambulatory Visit (HOSPITAL_COMMUNITY)
Admission: RE | Admit: 2016-06-01 | Discharge: 2016-06-01 | Disposition: A | Discharge status: Home / Self Care | Payer: 59 | Attending: Family Medicine | Admitting: Family Medicine

## 2016-06-01 ENCOUNTER — Other Ambulatory Visit (HOSPITAL_COMMUNITY): Payer: Self-pay | Admitting: *Deleted

## 2016-06-01 DIAGNOSIS — Z0000 Encounter for general adult medical examination without abnormal findings: Secondary | ICD-10-CM | POA: Diagnosis not present

## 2016-06-01 DIAGNOSIS — L820 Inflamed seborrheic keratosis: Secondary | ICD-10-CM | POA: Diagnosis not present

## 2016-06-01 DIAGNOSIS — L570 Actinic keratosis: Secondary | ICD-10-CM | POA: Diagnosis not present

## 2016-06-01 LAB — CBC WITH DIFFERENTIAL/PLATELET
Basophils Absolute: 0 10*3/uL (ref 0.0–0.1)
Basophils Relative: 0 %
EOS ABS: 0.1 10*3/uL (ref 0.0–0.7)
EOS PCT: 1 %
HCT: 42.2 % (ref 36.0–46.0)
Hemoglobin: 14.1 g/dL (ref 12.0–15.0)
LYMPHS ABS: 2.1 10*3/uL (ref 0.7–4.0)
Lymphocytes Relative: 29 %
MCH: 29.1 pg (ref 26.0–34.0)
MCHC: 33.4 g/dL (ref 30.0–36.0)
MCV: 87 fL (ref 78.0–100.0)
MONO ABS: 0.4 10*3/uL (ref 0.1–1.0)
MONOS PCT: 5 %
Neutro Abs: 4.7 10*3/uL (ref 1.7–7.7)
Neutrophils Relative %: 65 %
PLATELETS: 296 10*3/uL (ref 150–400)
RBC: 4.85 MIL/uL (ref 3.87–5.11)
RDW: 12.8 % (ref 11.5–15.5)
WBC: 7.3 10*3/uL (ref 4.0–10.5)

## 2016-06-01 LAB — LIPID PANEL
CHOL/HDL RATIO: 3.4 ratio
Cholesterol: 209 mg/dL — ABNORMAL HIGH (ref 0–200)
HDL: 61 mg/dL (ref >40)
LDL CALC: 132 mg/dL — AB (ref 0–99)
TRIGLYCERIDES: 80 mg/dL (ref <150)
VLDL: 16 mg/dL (ref 0–40)

## 2016-06-01 LAB — COMPREHENSIVE METABOLIC PANEL
ALT: 13 U/L — ABNORMAL LOW (ref 14–54)
ANION GAP: 6 (ref 5–15)
AST: 15 U/L (ref 15–41)
Albumin: 4.4 g/dL (ref 3.5–5.0)
Alkaline Phosphatase: 70 U/L (ref 38–126)
BUN: 12 mg/dL (ref 6–20)
CHLORIDE: 103 mmol/L (ref 101–111)
CO2: 28 mmol/L (ref 22–32)
Calcium: 9.5 mg/dL (ref 8.9–10.3)
Creatinine, Ser: 0.9 mg/dL (ref 0.44–1.00)
Glucose, Bld: 89 mg/dL (ref 65–99)
Potassium: 4.2 mmol/L (ref 3.5–5.1)
SODIUM: 137 mmol/L (ref 135–145)
Total Bilirubin: 0.9 mg/dL (ref 0.3–1.2)
Total Protein: 7.3 g/dL (ref 6.5–8.1)

## 2016-06-01 LAB — TSH: TSH: 1.884 u[IU]/mL (ref 0.350–4.500)

## 2016-06-11 ENCOUNTER — Ambulatory Visit (INDEPENDENT_AMBULATORY_CARE_PROVIDER_SITE_OTHER): Payer: 59 | Admitting: Family Medicine

## 2016-06-11 ENCOUNTER — Encounter: Payer: Self-pay | Admitting: Family Medicine

## 2016-06-11 VITALS — BP 117/76 | HR 69 | Temp 97.8°F | Resp 16 | Ht 67.25 in | Wt 135.8 lb

## 2016-06-11 DIAGNOSIS — R1084 Generalized abdominal pain: Secondary | ICD-10-CM

## 2016-06-11 DIAGNOSIS — R3129 Other microscopic hematuria: Secondary | ICD-10-CM | POA: Diagnosis not present

## 2016-06-11 DIAGNOSIS — Z0001 Encounter for general adult medical examination with abnormal findings: Secondary | ICD-10-CM | POA: Diagnosis not present

## 2016-06-11 DIAGNOSIS — Z0000 Encounter for general adult medical examination without abnormal findings: Secondary | ICD-10-CM

## 2016-06-11 LAB — URINALYSIS, ROUTINE W REFLEX MICROSCOPIC
BILIRUBIN URINE: NEGATIVE
Hgb urine dipstick: NEGATIVE
KETONES UR: NEGATIVE
LEUKOCYTES UA: NEGATIVE
Nitrite: NEGATIVE
PH: 7 (ref 5.0–8.0)
Specific Gravity, Urine: 1.015 (ref 1.000–1.030)
Total Protein, Urine: NEGATIVE
URINE GLUCOSE: NEGATIVE
UROBILINOGEN UA: 0.2 (ref 0.0–1.0)

## 2016-06-11 LAB — POCT URINALYSIS DIPSTICK
BILIRUBIN UA: NEGATIVE
GLUCOSE UA: NEGATIVE
Ketones, UA: NEGATIVE
LEUKOCYTES UA: NEGATIVE
NITRITE UA: NEGATIVE
PH UA: 7
Protein, UA: NEGATIVE
Spec Grav, UA: 1.02
Urobilinogen, UA: 0.2

## 2016-06-11 NOTE — Progress Notes (Addendum)
Office Note 06/11/2016  CC:  Chief Complaint  Patient presents with  . Annual Exam    HPI:  Robin Henry is a 60 y.o. White female who is here for annual health maintenance exam. Reviewed recent lab work in detail with patient today. Patient does have a GYN, last saw 6 mo ago.  She still needs to schedule her mammogram.  Intermittent bouts of L side/LUQ pain, also some bladder pain intermittently.  Some loose stools intermittently. Notes this more common after drinking red wine.  Describes the bladder pain as "deep, organ-type pain".  Has d/c'd caffeine. No blood in stool.  No melena.  No n/v.  Denies dysuria or hematuria.   Past Medical History:  Diagnosis Date  . Macular dystrophy    Hx of detached retina (Dr. Baird Cancer)  . Osteoarthritis, multiple sites    Primarily knees  . Subclinical hypothyroidism     Past Surgical History:  Procedure Laterality Date  . ABDOMINAL HYSTERECTOMY  2001   fibroids  . COLONOSCOPY  09/08/2012   Normal.  Recall 10 yrs.  Procedure: COLONOSCOPY;  Surgeon: Jeryl Columbia, MD;  Location: [facility];  Service: Endoscopy;  Laterality: N/A;  wants no sedation-but start iv    Family History  Problem Relation Age of Onset  . Hypertension Mother   . Emphysema Mother   . Cancer Mother     cartoid  . Colon cancer Paternal Grandfather   . Alcohol abuse Sister   . Alcohol abuse Maternal Grandmother   . Lung cancer Maternal Grandmother     Social History   Social History  . Marital status: Single    Spouse name: N/A  . Number of children: N/A  . Years of education: N/A   Occupational History  . Not on file.   Social History Main Topics  . Smoking status: Never Smoker  . Smokeless tobacco: Never Used  . Alcohol use Yes     Comment: rare  . Drug use: No  . Sexual activity: Yes   Other Topics Concern  . Not on file   Social History Narrative   Widow, no children.   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   Occup: CRNA   No T/A/Ds.   Exercises >3 X/week.       Outpatient Medications Prior to Visit  Medication Sig Dispense Refill  . Calcium-Vitamin D 600-200 MG-UNIT tablet Take 2 tablets by mouth daily.     . fish oil-omega-3 fatty acids 1000 MG capsule Take 2 g by mouth daily.      . Multiple Vitamins-Minerals (VISION-VITE PRESERVE PO) Take by mouth daily.      Marland Kitchen zoster vaccine live, PF, (ZOSTAVAX) 60454 UNT/0.65ML injection Inject 19,400 Units into the skin once. (Patient not taking: Reported on 06/11/2016) 1 vial 0   No facility-administered medications prior to visit.     No Known Allergies  ROS Review of Systems  Constitutional: Negative for appetite change, chills, fatigue and fever.  HENT: Negative for congestion, dental problem, ear pain and sore throat.   Eyes: Negative for discharge, redness and visual disturbance.  Respiratory: Negative for cough, chest tightness, shortness of breath and wheezing.   Cardiovascular: Negative for chest pain, palpitations and leg swelling.  Gastrointestinal: Negative for abdominal pain, blood in stool, diarrhea, nausea and vomiting.  Genitourinary: Negative for difficulty urinating, dysuria, flank pain, frequency, hematuria and urgency.  Musculoskeletal: Negative for back pain, joint swelling, myalgias and neck stiffness. Arthralgias: chronic knees and  low back.  Skin: Negative for pallor and rash.  Neurological: Negative for dizziness, speech difficulty, weakness and headaches.  Hematological: Negative for adenopathy. Does not bruise/bleed easily.  Psychiatric/Behavioral: Negative for confusion and sleep disturbance. The patient is not nervous/anxious.     PE; Blood pressure 117/76, pulse 69, temperature 97.8 F (36.6 C), temperature source Oral, resp. rate 16, height 5' 7.25" (1.708 m), weight 135 lb 12 oz (61.6 kg), SpO2 99 %. Gen: Alert, well appearing.  Patient is oriented to person, place, time, and situation. AFFECT: pleasant, lucid  thought and speech. ENT: Ears: EACs clear, normal epithelium.  TMs with good light reflex and landmarks bilaterally.  Eyes: no injection, icteris, swelling, or exudate.  EOMI, PERRLA. Nose: no drainage or turbinate edema/swelling.  No injection or focal lesion.  Mouth: lips without lesion/swelling.  Oral mucosa pink and moist.  Dentition intact and without obvious caries or gingival swelling.  Oropharynx without erythema, exudate, or swelling.  Neck: supple/nontender.  No LAD, mass, or TM.  Carotid pulses 2+ bilaterally, without bruits. CV: RRR, no m/r/g.   LUNGS: CTA bilat, nonlabored resps, good aeration in all lung fields. ABD: soft, NT, ND, BS normal.  No hepatospenomegaly or mass.  No bruits. EXT: no clubbing, cyanosis, or edema.  Musculoskeletal: no joint swelling, erythema, warmth, or tenderness.  ROM of all joints intact. Skin - no sores or suspicious lesions or rashes or color changes   Pertinent labs:  Lab Results  Component Value Date   TSH 1.884 06/01/2016   Lab Results  Component Value Date   WBC 7.3 06/01/2016   HGB 14.1 06/01/2016   HCT 42.2 06/01/2016   MCV 87.0 06/01/2016   PLT 296 06/01/2016   Lab Results  Component Value Date   CREATININE 0.90 06/01/2016   BUN 12 06/01/2016   NA 137 06/01/2016   K 4.2 06/01/2016   CL 103 06/01/2016   CO2 28 06/01/2016   Lab Results  Component Value Date   ALT 13 (L) 06/01/2016   AST 15 06/01/2016   ALKPHOS 70 06/01/2016   BILITOT 0.9 06/01/2016   Lab Results  Component Value Date   CHOL 209 (H) 06/01/2016   Lab Results  Component Value Date   HDL 61 06/01/2016   Lab Results  Component Value Date   LDLCALC 132 (H) 06/01/2016   Lab Results  Component Value Date   TRIG 80 06/01/2016   Lab Results  Component Value Date   CHOLHDL 3.4 06/01/2016   CC UA today: trace intact blood, otherwise normal.  ASSESSMENT AND PLAN:   Health maintenance exam:  Reviewed age and gender appropriate health maintenance  issues (prudent diet, regular exercise, health risks of tobacco and excessive alcohol, use of seatbelts, fire alarms in home, use of sunscreen).  Also reviewed age and gender appropriate health screening as well as vaccine recommendations. Labs reviewed: all good. She will schedule screening mammogram. Cervical ca screening not applicable since she had a hysterectomy for non-malignant reasons. Colon ca screening UTD: next colonoscopy due after 09/2022.  Reassured pt regarding her abdominal/bladder complaints.  Suspect IBS. However, I did check a UA today which was unremarkable except trace intact blood.  I sent a UA with reflex micro to the outside lab to see if she truly has microhematuria.   Also, I gave her hemoccults x 3 to take home.  An After Visit Summary was printed and given to the patient.  FOLLOW UP:  Return in about 1 year (around 06/11/2017)  for annual CPE (fasting).  Signed:  Crissie Sickles, MD           06/11/2016  ADDENDUM 06/11/16 at 4:35 pm:  UA at outside lab was normal, microscopy normal.  Today's UA here showing trace intact blood was a false positive.--PM

## 2016-06-21 DIAGNOSIS — H3554 Dystrophies primarily involving the retinal pigment epithelium: Secondary | ICD-10-CM | POA: Diagnosis not present

## 2016-08-19 ENCOUNTER — Other Ambulatory Visit: Payer: Self-pay | Admitting: Specialist

## 2016-08-19 DIAGNOSIS — Z1231 Encounter for screening mammogram for malignant neoplasm of breast: Secondary | ICD-10-CM

## 2016-09-21 ENCOUNTER — Ambulatory Visit (HOSPITAL_COMMUNITY)
Admission: EM | Admit: 2016-09-21 | Discharge: 2016-09-21 | Disposition: A | Discharge status: Home / Self Care | Payer: 59 | Attending: Family Medicine | Admitting: Family Medicine

## 2016-09-21 ENCOUNTER — Encounter (HOSPITAL_COMMUNITY): Payer: Self-pay | Admitting: Emergency Medicine

## 2016-09-21 DIAGNOSIS — E039 Hypothyroidism, unspecified: Secondary | ICD-10-CM | POA: Diagnosis not present

## 2016-09-21 DIAGNOSIS — M1990 Unspecified osteoarthritis, unspecified site: Secondary | ICD-10-CM | POA: Insufficient documentation

## 2016-09-21 DIAGNOSIS — J029 Acute pharyngitis, unspecified: Secondary | ICD-10-CM | POA: Diagnosis not present

## 2016-09-21 DIAGNOSIS — J028 Acute pharyngitis due to other specified organisms: Secondary | ICD-10-CM

## 2016-09-21 DIAGNOSIS — G710 Muscular dystrophy: Secondary | ICD-10-CM | POA: Diagnosis not present

## 2016-09-21 MED ORDER — AZITHROMYCIN 250 MG PO TABS: 250.0000 mg | ORAL_TABLET | Freq: Every day | ORAL | 0 refills | Status: DC

## 2016-09-21 MED ORDER — IPRATROPIUM BROMIDE 0.06 % NA SOLN: 2.0000 | Freq: Four times a day (QID) | NASAL | 0 refills | Status: DC

## 2016-09-21 NOTE — ED Provider Notes (Addendum)
CSN: [id number]     Arrival date & time 09/21/16  1935 History   None    Chief Complaint  Patient presents with  . Sore Throat   (Consider location/radiation/quality/duration/timing/severity/associated sxs/prior Treatment) Patient c/o sore throat.  She was exposed to strep throat a few days ago and now she is developing URI sx's and sore throat.   The history is provided by the patient.  Sore Throat  This is a new problem. The current episode started 12 to 24 hours ago. The problem occurs constantly. The problem has not changed since onset.Nothing aggravates the symptoms. Nothing relieves the symptoms. She has tried nothing for the symptoms.    Past Medical History:  Diagnosis Date  . Macular dystrophy    Hx of detached retina (Dr. Baird Cancer)  . Osteoarthritis, multiple sites    Primarily knees  . Subclinical hypothyroidism    Past Surgical History:  Procedure Laterality Date  . ABDOMINAL HYSTERECTOMY  2001   fibroids  . COLONOSCOPY  09/08/2012   Normal.  Recall 10 yrs.  Procedure: COLONOSCOPY;  Surgeon: Jeryl Columbia, MD;  Location: [facility];  Service: Endoscopy;  Laterality: N/A;  wants no sedation-but start iv   Family History  Problem Relation Age of Onset  . Hypertension Mother   . Emphysema Mother   . Cancer Mother     cartoid  . Colon cancer Paternal Grandfather   . Alcohol abuse Sister   . Alcohol abuse Maternal Grandmother   . Lung cancer Maternal Grandmother    Social History  Substance Use Topics  . Smoking status: Never Smoker  . Smokeless tobacco: Never Used  . Alcohol use Yes     Comment: rare   OB History    No data available     Review of Systems  Constitutional: Negative.   HENT: Positive for sore throat.   Eyes: Negative.   Respiratory: Negative.   Cardiovascular: Negative.   Gastrointestinal: Negative.   Endocrine: Negative.   Genitourinary: Negative.   Musculoskeletal: Negative.   Allergic/Immunologic: Negative.   Neurological:  Negative.   Hematological: Negative.   Psychiatric/Behavioral: Negative.     Allergies  Patient has no known allergies.  Home Medications   Prior to Admission medications   Medication Sig Start Date End Date Taking? Authorizing Provider  Calcium-Vitamin D 600-200 MG-UNIT tablet Take 2 tablets by mouth daily.    Yes Historical Provider, MD  Coenzyme Q10 200 MG capsule Take 200 mg by mouth 2 times daily at 12 noon and 4 pm.   Yes Historical Provider, MD  fish oil-omega-3 fatty acids 1000 MG capsule Take 2 g by mouth daily.     Yes Historical Provider, MD  Multiple Vitamins-Minerals (VISION-VITE PRESERVE PO) Take by mouth daily.     Yes Historical Provider, MD  Nutritional Supplements (CHOLESTEROL DEFENSE PO) Take 900 mg elemental calcium/kg/hr by mouth 2 (two) times daily.   Yes Historical Provider, MD  VITAMIN D, CHOLECALCIFEROL, PO Take 500 mg by mouth 1 day or 1 dose.   Yes Historical Provider, MD  azithromycin (ZITHROMAX) 250 MG tablet Take 1 tablet (250 mg total) by mouth daily. Take first 2 tablets together, then 1 every day until finished. 09/21/16   Lysbeth Penner, FNP  ipratropium (ATROVENT) 0.06 % nasal spray Place 2 sprays into both nostrils 4 (four) times daily. 09/21/16   Lysbeth Penner, FNP  zoster vaccine live, PF, (ZOSTAVAX) 16109 UNT/0.65ML injection Inject 19,400 Units into the skin once. 10/28/15  [name], MD   Meds Ordered and Administered this Visit  Medications - No data to display  BP 121/84 (BP Location: Left Arm)   Pulse 66   Temp 97.6 F (36.4 C) (Oral)   Resp 18   SpO2 98%  No data found.   Physical Exam  Constitutional: She appears well-developed and well-nourished.  HENT:  Head: Normocephalic and atraumatic.  Right Ear: External ear normal.  Left Ear: External ear normal.  Mouth/Throat: Oropharynx is clear and moist.  Eyes: Conjunctivae and EOM are normal. Pupils are equal, round, and reactive to light.  Neck: Normal range of motion.  Neck supple.  Cardiovascular: Normal rate, regular rhythm and normal heart sounds.   Pulmonary/Chest: Effort normal and breath sounds normal.  Abdominal: Soft. Bowel sounds are normal.  Musculoskeletal: Normal range of motion.  Nursing note and vitals reviewed.   Urgent Care Course   Clinical Course     Procedures (including critical care time)  Labs Review Labs Reviewed - No data to display  Imaging Review No results found.   Visual Acuity Review  Right Eye Distance:   Left Eye Distance:   Bilateral Distance:    Right Eye Near:   Left Eye Near:    Bilateral Near:         MDM   1. Pharyngitis due to other organism    Zpak as directed Ipratropium 0.06% 2 sprays per nostril qid #66ml Push po fluids, rest, tylenol and motrin otc prn as directed for fever, arthralgias, and myalgias.  Follow up prn if sx's continue or persist.   Lysbeth Penner, FNP 09/21/16 [address] [place], [place] 09/21/16 2043

## 2016-09-21 NOTE — ED Triage Notes (Signed)
The patient presented to the [facility] with a complaint of a sore throat x 2 days. The patient reported exposure to a know strep positive patient 4 days ago.

## 2016-09-22 LAB — POCT RAPID STREP A: STREPTOCOCCUS, GROUP A SCREEN (DIRECT): NEGATIVE

## 2016-09-22 MED FILL — IPRATROPIUM 0.06% SPRAY: 0.06 | 11 days supply | Qty: 15 | Fill #0

## 2016-09-22 MED FILL — AZITHROMYCIN 250 MG TABLET: 250 | 5 days supply | Qty: 6 | Fill #0

## 2016-09-24 LAB — CULTURE, GROUP A STREP (THRC)

## 2016-09-29 ENCOUNTER — Ambulatory Visit
Admission: RE | Admit: 2016-09-29 | Discharge: 2016-09-29 | Disposition: A | Discharge status: Home / Self Care | Payer: 59 | Source: Ambulatory Visit | Attending: Specialist | Admitting: Specialist

## 2016-09-29 DIAGNOSIS — Z1231 Encounter for screening mammogram for malignant neoplasm of breast: Secondary | ICD-10-CM

## 2016-10-04 DIAGNOSIS — R490 Dysphonia: Secondary | ICD-10-CM

## 2016-10-04 HISTORY — DX: Dysphonia: R49.0

## 2016-10-27 ENCOUNTER — Ambulatory Visit (INDEPENDENT_AMBULATORY_CARE_PROVIDER_SITE_OTHER): Payer: 59 | Admitting: Family Medicine

## 2016-10-27 ENCOUNTER — Encounter: Payer: Self-pay | Admitting: Family Medicine

## 2016-10-27 VITALS — BP 103/71 | HR 77 | Temp 98.2°F | Resp 20 | Wt 134.5 lb

## 2016-10-27 DIAGNOSIS — J0100 Acute maxillary sinusitis, unspecified: Secondary | ICD-10-CM

## 2016-10-27 MED ORDER — DOXYCYCLINE HYCLATE 100 MG PO TABS: 100.0000 mg | ORAL_TABLET | Freq: Two times a day (BID) | ORAL | 0 refills | Status: DC

## 2016-10-27 MED FILL — DOXYCYCLINE HYCLATE 100 MG: 100 | 10 days supply | Qty: 20 | Fill #0

## 2016-10-27 NOTE — Patient Instructions (Signed)
Plain mucinex or mucinex DM Flonase daily  Doxycyline every 12 hours, 10 days.    Rest and hydrate.     Sinusitis, Adult Sinusitis is soreness and inflammation of your sinuses. Sinuses are hollow spaces in the bones around your face. They are located:  Around your eyes.  In the middle of your forehead.  Behind your nose.  In your cheekbones. Your sinuses and nasal passages are lined with a stringy fluid (mucus). Mucus normally drains out of your sinuses. When your nasal tissues get inflamed or swollen, the mucus can get trapped or blocked so air cannot flow through your sinuses. This lets bacteria, viruses, and funguses grow, and that leads to infection. Follow these instructions at home: Medicines  Take, use, or apply over-the-counter and prescription medicines only as told by your doctor. These may include nasal sprays.  If you were prescribed an antibiotic medicine, take it as told by your doctor. Do not stop taking the antibiotic even if you start to feel better. Hydrate and Humidify  Drink enough water to keep your pee (urine) clear or pale yellow.  Use a cool mist humidifier to keep the humidity level in your home above 50%.  Breathe in steam for 10-15 minutes, 3-4 times a day or as told by your doctor. You can do this in the bathroom while a hot shower is running.  Try not to spend time in cool or dry air. Rest  Rest as much as possible.  Sleep with your head raised (elevated).  Make sure to get enough sleep each night. General instructions  Put a warm, moist washcloth on your face 3-4 times a day or as told by your doctor. This will help with discomfort.  Wash your hands often with soap and water. If there is no soap and water, use hand sanitizer.  Do not smoke. Avoid being around people who are smoking (secondhand smoke).  Keep all follow-up visits as told by your doctor. This is important. Contact a doctor if:  You have a fever.  Your symptoms get  worse.  Your symptoms do not get better within 10 days. Get help right away if:  You have a very bad headache.  You cannot stop throwing up (vomiting).  You have pain or swelling around your face or eyes.  You have trouble seeing.  You feel confused.  Your neck is stiff.  You have trouble breathing. This information is not intended to replace advice given to you by your health care provider. Make sure you discuss any questions you have with your health care provider. Document Released: 03/08/2008 Document Revised: 05/16/2016 Document Reviewed: 07/16/2015 Elsevier Interactive Patient Education  2017 [employer].

## 2016-10-27 NOTE — Progress Notes (Signed)
Robin Henry , 1956-03-28, 61 y.o., female MRN: [id number] Patient Care Team    Relationship Specialty Notifications Start End  [name], MD PCP - General Family Medicine  05/28/16     CC: cough  Subjective: Pt presents for an acute OV with complaints of cough of 4 days  duration.  Associated symptoms include productive cough, fatigue, fever/sweaty, nasal congestion, ear pain. She was seen in December  And provided a Z-pack and symptoms resolved, but she feels this worse. She denies nausea, vomit, diarrhea,Chills or abdominal pain. She feels like she is "harboring something" because she has been around many people that are ill. She has tried a few over-the-counter preparations for cold and sinus, that was noneffective.  No Known Allergies Social History  Substance Use Topics  . Smoking status: Never Smoker  . Smokeless tobacco: Never Used  . Alcohol use Yes     Comment: rare   Past Medical History:  Diagnosis Date  . Macular dystrophy    Hx of detached retina (Dr. Baird Cancer)  . Osteoarthritis, multiple sites    Primarily knees  . Subclinical hypothyroidism    Past Surgical History:  Procedure Laterality Date  . ABDOMINAL HYSTERECTOMY  2001   fibroids  . COLONOSCOPY  09/08/2012   Normal.  Recall 10 yrs.  Procedure: COLONOSCOPY;  Surgeon: Jeryl Columbia, MD;  Location: [facility];  Service: Endoscopy;  Laterality: N/A;  wants no sedation-but start iv   Family History  Problem Relation Age of Onset  . Hypertension Mother   . Emphysema Mother   . Cancer Mother     cartoid  . Colon cancer Paternal Grandfather   . Alcohol abuse Sister   . Alcohol abuse Maternal Grandmother   . Lung cancer Maternal Grandmother    Allergies as of 10/27/2016   No Known Allergies     Medication List       Accurate as of 10/27/16 10:44 AM. Always use your most recent med list.          Calcium-Vitamin D 600-200 MG-UNIT tablet Take 2 tablets by mouth daily.   CHOLESTEROL DEFENSE  PO Take 900 mg elemental calcium/kg/hr by mouth 2 (two) times daily.   Coenzyme Q10 200 MG capsule Take 200 mg by mouth 2 times daily at 12 noon and 4 pm.   fish oil-omega-3 fatty acids 1000 MG capsule Take 2 g by mouth daily.   ipratropium 0.06 % nasal spray Commonly known as:  ATROVENT Place 2 sprays into both nostrils 4 (four) times daily.   VISION-VITE PRESERVE PO Take by mouth daily.   VITAMIN D (CHOLECALCIFEROL) PO Take 500 mg by mouth 1 day or 1 dose.       No results found for this or any previous visit (from the past 24 hour(s)). No results found.   ROS: Negative, with the exception of above mentioned in HPI   Objective:  BP 103/71 (BP Location: Left Arm, Patient Position: Sitting, Cuff Size: Normal)   Pulse 77   Temp 98.2 F (36.8 C)   Resp 20   Wt 134 lb 8 oz (61 kg)   SpO2 97%   BMI 20.91 kg/m  Body mass index is 20.91 kg/m. Gen: Afebrile. No acute distress. Nontoxic in appearance, well developed, well nourished.  HENT: AT. [place]. Bilateral TM visualized Air-fluid levels bilaterally.. MMM, no oral lesions. Bilateral nares with erythema and drainage. Throat without erythema or exudates. Nasal drip, cough, bilateral sinus pressure present. Eyes:Pupils Equal  Round Reactive to light, Extraocular movements intact,  Conjunctiva without redness, discharge or icterus. Neck/lymp/endocrine: Supple, no lymphadenopathy CV: RRR  Chest: CTAB, no wheeze or crackles.  Abd: Soft. NTND. BS present.   Assessment/Plan: Robin Henry is a 61 y.o. female present for acute OV for  Acute maxillary sinusitis, recurrence not specified Rest, hydrate, Mucinex and Flonase. Doxycycline twice a day 10 days prescribed. Follow-up when necessary, or if symptoms worsening or do not improve within 2 weeks.   electronically signed by:  Howard Pouch, DO  [place]

## 2016-10-29 ENCOUNTER — Telehealth: Payer: Self-pay | Admitting: Family Medicine

## 2016-10-29 NOTE — Telephone Encounter (Signed)
LM for patient to CB to schedule an appt with Dr. Raoul Pitch

## 2016-10-29 NOTE — Telephone Encounter (Signed)
[name], MD  Diane S [facility]        Yes, ok with me.  --PM

## 2016-10-29 NOTE — Telephone Encounter (Signed)
-----   Message from Ma Hillock, DO sent at 10/27/2016 12:04 PM EST ----- That is ok with me.  Dr. Raliegh Ip ----- Message ----- From: Nickola Major Sent: 10/27/2016  11:02 AM To: [name], MD, Ma Hillock, DO  Patient likes both Dr. Anitra Lauth & Dr. Raoul Pitch but would like to switch a female physician as primary care physician. Is that okay?

## 2016-11-04 DIAGNOSIS — K219 Gastro-esophageal reflux disease without esophagitis: Secondary | ICD-10-CM

## 2016-11-04 HISTORY — DX: Gastro-esophageal reflux disease without esophagitis: K21.9

## 2016-11-15 ENCOUNTER — Encounter: Payer: Self-pay | Admitting: Family Medicine

## 2016-11-15 DIAGNOSIS — R05 Cough: Secondary | ICD-10-CM | POA: Diagnosis not present

## 2016-11-15 DIAGNOSIS — J310 Chronic rhinitis: Secondary | ICD-10-CM | POA: Diagnosis not present

## 2016-11-15 DIAGNOSIS — R053 Chronic cough: Secondary | ICD-10-CM | POA: Insufficient documentation

## 2016-11-15 DIAGNOSIS — R490 Dysphonia: Secondary | ICD-10-CM | POA: Insufficient documentation

## 2016-11-15 DIAGNOSIS — K219 Gastro-esophageal reflux disease without esophagitis: Secondary | ICD-10-CM | POA: Diagnosis not present

## 2016-11-15 DIAGNOSIS — F458 Other somatoform disorders: Secondary | ICD-10-CM | POA: Diagnosis not present

## 2016-11-15 MED FILL — OMEPRAZOLE DR 40 MG CAPSULE: 40 | 30 days supply | Qty: 30 | Fill #0

## 2016-12-21 ENCOUNTER — Ambulatory Visit: Payer: 59 | Admitting: Family Medicine

## 2016-12-22 ENCOUNTER — Ambulatory Visit (INDEPENDENT_AMBULATORY_CARE_PROVIDER_SITE_OTHER): Payer: 59 | Admitting: Family Medicine

## 2016-12-22 ENCOUNTER — Encounter: Payer: Self-pay | Admitting: Family Medicine

## 2016-12-22 VITALS — BP 118/82 | HR 70 | Temp 98.1°F | Resp 20 | Wt 140.0 lb

## 2016-12-22 DIAGNOSIS — L089 Local infection of the skin and subcutaneous tissue, unspecified: Secondary | ICD-10-CM

## 2016-12-22 MED ORDER — DOXYCYCLINE HYCLATE 100 MG PO TABS: 100.0000 mg | ORAL_TABLET | Freq: Two times a day (BID) | ORAL | 0 refills | Status: DC

## 2016-12-22 MED FILL — DOXYCYCLINE HYCLATE 100 MG: 100 | 7 days supply | Qty: 14 | Fill #0

## 2016-12-22 NOTE — Progress Notes (Signed)
Robin Henry , January 05, 1956, 61 y.o., female MRN: [id number] Patient Care Team    Relationship Specialty Notifications Start End  Robin Sou, MD PCP - General Family Medicine  05/28/16   Robin Belfast, MD Consulting Physician Otolaryngology  11/15/16     CC: right upper eyelid infection Subjective: Pt presents for an  OV with complaints of infected boil of 2 days duration.  Associated symptoms include drainage and tenderness over site. She denies fever, chills, nausea or vomit. She denies pain of the eyeball or globe.  - using neosporin and cleaning with alcohol.   No flowsheet data found.  No Known Allergies Social History  Substance Use Topics  . Smoking status: Never Smoker  . Smokeless tobacco: Never Used  . Alcohol use Yes     Comment: rare   Past Medical History:  Diagnosis Date  . Hoarseness 2018   with cough; laryngoscopy by Dr. Wilburn Cornelia showed all normal except postglottic erythema c/w reflux.  Pt started on PPI q 5 PM.  If cough persists, consider referral to [facility].  . Laryngopharyngeal reflux (LPR) 11/2016  . Macular dystrophy    Hx of detached retina (Dr. Baird Cancer)  . Osteoarthritis, multiple sites    Primarily knees  . Subclinical hypothyroidism    Past Surgical History:  Procedure Laterality Date  . ABDOMINAL HYSTERECTOMY  2001   fibroids  . COLONOSCOPY  09/08/2012   Normal.  Recall 10 yrs.  Procedure: COLONOSCOPY;  Surgeon: Jeryl Columbia, MD;  Location: [facility];  Service: Endoscopy;  Laterality: N/A;  wants no sedation-but start iv   Family History  Problem Relation Age of Onset  . Hypertension Mother   . Emphysema Mother   . Cancer Mother     cartoid  . Colon cancer Paternal Grandfather   . Alcohol abuse Sister   . Alcohol abuse Maternal Grandmother   . Lung cancer Maternal Grandmother    Allergies as of 12/22/2016   No Known Allergies     Medication List       Accurate as of 12/22/16  1:58 PM. Always use your most  recent med list.          Calcium-Vitamin D 600-200 MG-UNIT tablet Take 2 tablets by mouth daily.   CHOLESTEROL DEFENSE PO Take 900 mg elemental calcium/kg/hr by mouth 2 (two) times daily.   Coenzyme Q10 200 MG capsule Take 200 mg by mouth 2 times daily at 12 noon and 4 pm.   fish oil-omega-3 fatty acids 1000 MG capsule Take 2 g by mouth daily.   ipratropium 0.06 % nasal spray Commonly known as:  ATROVENT Place 2 sprays into both nostrils 4 (four) times daily.   VISION-VITE PRESERVE PO Take by mouth daily.   VITAMIN D (CHOLECALCIFEROL) PO Take 500 mg by mouth 1 day or 1 dose.       No results found for this or any previous visit (from the past 24 hour(s)). No results found.   ROS: Negative, with the exception of above mentioned in HPI   Objective:  BP 118/82 (BP Location: Right Arm, Patient Position: Sitting, Cuff Size: Normal)   Pulse 70   Temp 98.1 F (36.7 C)   Resp 20   Wt 140 lb (63.5 kg)   SpO2 100%   BMI 21.76 kg/m  Body mass index is 21.76 kg/m. Gen: Afebrile. No acute distress. Nontoxic in appearance, well developed, well nourished.  HENT: AT. [place].  MMM, no oral lesions.  Eyes:Pupils Equal Round Reactive to light, Extraocular movements intact without pain ,  Conjunctiva without redness, discharge or icterus. Neck/lymp/endocrine: Supple,no lymphadenopathy Skin: Small <0.25 cm small boil formation, with erythema, no current drainage. No fluctuance.   Assessment/Plan: THERMA LASURE is a 61 y.o. female present for OV for  Skin infection - doxy prescribed, would have ordered 3-5 days, pt extremely concerned bc she works in [place], will provide 7 days.  - keep area clean and covered.  - doxycycline (VIBRA-TABS) 100 MG tablet; Take 1 tablet (100 mg total) by mouth 2 (two) times daily.  Dispense: 14 tablet; Refill: 0 - F/u PRN  Reviewed expectations re: course of current medical issues.  Discussed self-management of symptoms.  Outlined signs and symptoms  indicating need for more acute intervention.  Patient verbalized understanding and all questions were answered.  Patient received an After-Visit Summary.   electronically signed by:  Howard Pouch, DO  [place]

## 2016-12-22 NOTE — Patient Instructions (Addendum)
Doxycyline prescribed.  Keep area clean.  You can use neosporin.  You should be seen immediately if fever, chills, pain with moving eyeball.    Please help [place] help you:  We are honored you have chosen [facility] for your Primary Care home. Below you will find basic instructions that you may need to access in the future. Please help [place] help you by reading the instructions, which cover many of the frequent questions we experience.   Prescription refills and request:  -In order to allow more efficient response time, please call your pharmacy for all refills. They will forward the request electronically to [place]. This allows for the quickest possible response. Request left on a nurse line can take longer to refill, since these are checked as time allows between office patients and other phone calls.  - refill request can take up to 3-5 working days to complete.  - If request is sent electronically and request is appropiate, it is usually completed in 1-2 business days.  - all patients will need to be seen routinely for all chronic medical conditions requiring prescription medications (see follow-up below). If you are overdue for follow up on your condition, you will be asked to make an appointment and we will call in enough medication to cover you until your appointment (up to 30 days).  - all controlled substances will require a face to face visit to request/refill.  - if you desire your prescriptions to go through a new pharmacy, and have an active script at original pharmacy, you will need to call your pharmacy and have scripts transferred to new pharmacy. This is completed between the pharmacy locations and not by your provider.    Results: If any images or labs were ordered, it can take up to 1 week to get results depending on the test ordered and the lab/facility running and resulting the test. - Normal or stable results, which do not need further discussion, will be released to your mychart  immediately with attached note to you. A call will not be generated for normal results. Please make certain to sign up for mychart. If you have questions on how to activate your mychart you can call the front office.  - If your results need further discussion, our office will attempt to contact you via phone, and if unable to reach you after 2 attempts, we will release your abnormal result to your mychart with instructions.  - All results will be automatically released in mychart after 1 week.  - Your provider will provide you with explanation and instruction on all relevant material in your results. Please keep in mind, results and labs may appear confusing or abnormal to the untrained eye, but it does not mean they are actually abnormal for you personally. If you have any questions about your results that are not covered, or you desire more detailed explanation than what was provided, you should make an appointment with your provider to do so.   Our office handles many outgoing and incoming calls daily. If we have not contacted you within 1 week about your results, please check your mychart to see if there is a message first and if not, then contact our office.  In helping with this matter, you help decrease call volume, and therefore allow [place] to be able to respond to patients needs more efficiently.   Acute office visits (sick visit):  An acute visit is intended for a new problem and are scheduled in shorter  time slots to allow schedule openings for patients with new problems. This is the appropriate visit to discuss a new problem. In order to provide you with excellent quality medical care with proper time for you to explain your problem, have an exam and receive treatment with instructions, these appointments should be limited to one new problem per visit. If you experience a new problem, in which you desire to be addressed, please make an acute office visit, we save openings on the schedule to  accommodate you. Please do not save your new problem for any other type of visit, let us take care of it properly and quickly for you.   Follow up visits:  Depending on your condition(s) your provider will need to see you routinely in order to provide you with quality care and prescribe medication(s). Most chronic conditions (Example: hypertension, Diabetes, depression/anxiety... etc), require visits a couple times a year. Your provider will instruct you on proper follow up for your personal medical conditions and history. Please make certain to make follow up appointments for your condition as instructed. Failing to do so could result in lapse in your medication treatment/refills. If you request a refill, and are overdue to be seen on a condition, we will always provide you with a 30 day script (once) to allow you time to schedule.    Medicare wellness (well visit): - we have a wonderful Nurse Maudie Mercury), that will meet with you and provide you will yearly medicare wellness visits. These visits should occur yearly (can not be scheduled less than 1 calendar year apart) and cover preventive health, immunizations, advance directives and screenings you are entitled to yearly through your medicare benefits. Do not miss out on your entitled benefits, this is when medicare will pay for these benefits to be ordered for you.  These are strongly encouraged by your provider and is the appropriate type of visit to make certain you are up to date with all preventive health benefits. If you have not had your medicare wellness exam in the last 12 months, please make certain to schedule one by calling the office and schedule your medicare wellness with Maudie Mercury as soon as possible.   Yearly physical (well visit):  - Adults are recommended to be seen yearly for physicals. Check with your insurance and date of your last physical, most insurances require one calendar year between physicals. Physicals include all preventive health  topics, screenings, medical exam and labs that are appropriate for gender/age and history. You may have fasting labs needed at this visit. This is a well visit (not a sick visit), acute topics should not be covered during this visit.  - Pediatric patients are seen more frequently when they are younger. Your provider will advise you on well child visit timing that is appropriate for your their age. - This is not a medicare wellness visit. Medicare wellness exams do not have an exam portion to the visit. Some medicare companies allow for a physical, some do not allow a yearly physical. If your medicare allows a yearly physical you can schedule the medicare wellness with our nurse Maudie Mercury and have your physical with your provider after, on the same day. Please check with insurance for your full benefits.   Late Policy/No Shows:  - all new patients should arrive 15-30 minutes earlier than appointment to allow [place] time  to  obtain all personal demographics,  insurance information and for you to complete office paperwork. - All established patients should arrive 10-15 minutes  earlier than appointment time to update all information and be checked in .  - In our best efforts to run on time, if you are late for your appointment you will be asked to either reschedule or if able, we will work you back into the schedule. There will be a wait time to work you back in the schedule,  depending on availability.  - If you are unable to make it to your appointment as scheduled, please call 24 hours ahead of time to allow [place] to fill the time slot with someone else who needs to be seen. If you do not cancel your appointment ahead of time, you may be charged a no show fee.

## 2016-12-27 DIAGNOSIS — H524 Presbyopia: Secondary | ICD-10-CM | POA: Diagnosis not present

## 2017-01-25 DIAGNOSIS — Z6821 Body mass index (BMI) 21.0-21.9, adult: Secondary | ICD-10-CM | POA: Diagnosis not present

## 2017-01-25 DIAGNOSIS — Z01419 Encounter for gynecological examination (general) (routine) without abnormal findings: Secondary | ICD-10-CM | POA: Diagnosis not present

## 2017-01-25 DIAGNOSIS — Z90710 Acquired absence of both cervix and uterus: Secondary | ICD-10-CM | POA: Diagnosis not present

## 2017-01-28 ENCOUNTER — Other Ambulatory Visit (HOSPITAL_COMMUNITY)
Admission: RE | Admit: 2017-01-28 | Discharge: 2017-01-28 | Disposition: A | Discharge status: Home / Self Care | Payer: 59 | Source: Ambulatory Visit | Attending: Specialist | Admitting: Specialist

## 2017-01-28 DIAGNOSIS — R5383 Other fatigue: Secondary | ICD-10-CM | POA: Insufficient documentation

## 2017-01-28 LAB — COMPREHENSIVE METABOLIC PANEL
ALT: 14 U/L (ref 14–54)
AST: 16 U/L (ref 15–41)
Albumin: 4.2 g/dL (ref 3.5–5.0)
Alkaline Phosphatase: 75 U/L (ref 38–126)
Anion gap: 9 (ref 5–15)
BILIRUBIN TOTAL: 1.1 mg/dL (ref 0.3–1.2)
BUN: 16 mg/dL (ref 6–20)
CHLORIDE: 105 mmol/L (ref 101–111)
CO2: 27 mmol/L (ref 22–32)
CREATININE: 0.85 mg/dL (ref 0.44–1.00)
Calcium: 9.4 mg/dL (ref 8.9–10.3)
GFR calc Af Amer: >60 mL/min (ref >60)
Glucose, Bld: 97 mg/dL (ref 65–99)
Potassium: 3.9 mmol/L (ref 3.5–5.1)
Sodium: 141 mmol/L (ref 135–145)
TOTAL PROTEIN: 7.3 g/dL (ref 6.5–8.1)

## 2017-01-28 LAB — LIPID PANEL
CHOLESTEROL: 207 mg/dL — AB (ref 0–200)
HDL: 65 mg/dL (ref >40)
LDL Cholesterol: 127 mg/dL — ABNORMAL HIGH (ref 0–99)
Total CHOL/HDL Ratio: 3.2 RATIO
Triglycerides: 73 mg/dL (ref <150)
VLDL: 15 mg/dL (ref 0–40)

## 2017-01-28 LAB — CBC
HCT: 41.4 % (ref 36.0–46.0)
Hemoglobin: 13.9 g/dL (ref 12.0–15.0)
MCH: 29 pg (ref 26.0–34.0)
MCHC: 33.6 g/dL (ref 30.0–36.0)
MCV: 86.4 fL (ref 78.0–100.0)
Platelets: 313 10*3/uL (ref 150–400)
RBC: 4.79 MIL/uL (ref 3.87–5.11)
RDW: 13.2 % (ref 11.5–15.5)
WBC: 6.1 10*3/uL (ref 4.0–10.5)

## 2017-01-28 LAB — TSH: TSH: 3.364 u[IU]/mL (ref 0.350–4.500)

## 2017-01-29 LAB — VITAMIN D 25 HYDROXY (VIT D DEFICIENCY, FRACTURES): Vit D, 25-Hydroxy: 43 ng/mL (ref 30.0–100.0)

## 2017-02-02 ENCOUNTER — Telehealth: Payer: Self-pay | Admitting: Family Medicine

## 2017-02-02 NOTE — Telephone Encounter (Signed)
Patient called in to see if her lab results are available that were drawn recently at [facility]

## 2017-02-03 NOTE — Telephone Encounter (Signed)
I did not order any labs for her (of which I am aware), so I would not have received a report on them.  Who ordered the labs?

## 2017-02-03 NOTE — Telephone Encounter (Signed)
This pt has transferred to you.

## 2017-02-03 NOTE — Telephone Encounter (Signed)
Please advise. Thanks.  

## 2017-02-03 NOTE — Telephone Encounter (Signed)
Left message for patient to call back to clarify what she is referring to we have not ordered any lab work for her.

## 2017-02-08 NOTE — Telephone Encounter (Signed)
Patient has not called back to clarify will close note at this time.

## 2017-05-24 MED FILL — AMOXICILLIN 500 MG CAPSULE: 500 | 7 days supply | Qty: 21 | Fill #0

## 2017-06-01 MED FILL — NAPROXEN SODIUM 550 MG TAB: 550 | 6 days supply | Qty: 12 | Fill #0

## 2017-06-01 MED FILL — CHLORHEXIDINE 0.12% RINSE: 0.12 | 15 days supply | Qty: 473 | Fill #0

## 2017-06-17 ENCOUNTER — Encounter: Payer: 59 | Admitting: Family Medicine

## 2017-07-22 DIAGNOSIS — D3132 Benign neoplasm of left choroid: Secondary | ICD-10-CM | POA: Diagnosis not present

## 2017-07-22 DIAGNOSIS — H43391 Other vitreous opacities, right eye: Secondary | ICD-10-CM | POA: Diagnosis not present

## 2017-07-22 DIAGNOSIS — H3554 Dystrophies primarily involving the retinal pigment epithelium: Secondary | ICD-10-CM | POA: Diagnosis not present

## 2017-07-22 DIAGNOSIS — H43811 Vitreous degeneration, right eye: Secondary | ICD-10-CM | POA: Diagnosis not present

## 2017-09-23 ENCOUNTER — Ambulatory Visit (INDEPENDENT_AMBULATORY_CARE_PROVIDER_SITE_OTHER): Payer: 59

## 2017-09-23 ENCOUNTER — Ambulatory Visit (HOSPITAL_COMMUNITY)
Admission: EM | Admit: 2017-09-23 | Discharge: 2017-09-23 | Disposition: A | Discharge status: Home / Self Care | Payer: 59 | Attending: Internal Medicine | Admitting: Internal Medicine

## 2017-09-23 ENCOUNTER — Encounter (HOSPITAL_COMMUNITY): Payer: Self-pay | Admitting: Family Medicine

## 2017-09-23 DIAGNOSIS — J40 Bronchitis, not specified as acute or chronic: Secondary | ICD-10-CM

## 2017-09-23 DIAGNOSIS — R05 Cough: Secondary | ICD-10-CM | POA: Diagnosis not present

## 2017-09-23 MED ORDER — AEROCHAMBER PLUS FLO-VU MEDIUM MISC
1.0000 | Freq: Once | Status: AC
  Administered 2017-09-23: 1

## 2017-09-23 MED ORDER — CEFDINIR 300 MG PO CAPS: 300.0000 mg | ORAL_CAPSULE | Freq: Two times a day (BID) | ORAL | 0 refills | Status: AC

## 2017-09-23 MED ORDER — AEROCHAMBER PLUS FLO-VU LARGE MISC
Status: AC
  Filled 2017-09-23: qty 1

## 2017-09-23 MED ORDER — ALBUTEROL SULFATE HFA 108 (90 BASE) MCG/ACT IN AERS: 1.0000 | INHALATION_SPRAY | Freq: Four times a day (QID) | RESPIRATORY_TRACT | 0 refills | Status: DC | PRN

## 2017-09-23 MED ORDER — ALBUTEROL SULFATE HFA 108 (90 BASE) MCG/ACT IN AERS
INHALATION_SPRAY | RESPIRATORY_TRACT | Status: AC
  Filled 2017-09-23: qty 6.7

## 2017-09-23 MED FILL — VENTOLIN HFA 90 MCG INHALER: 108 (90 BAS | 25 days supply | Qty: 18 | Fill #0

## 2017-09-23 MED FILL — CEFDINIR 300 MG CAPSULE: 300 | 5 days supply | Qty: 10 | Fill #0

## 2017-09-23 NOTE — ED Triage Notes (Signed)
Pt here for cough, congestion, fever, chills, fatigue, loss of appetite x 1 month. Reports that she was better at one point but now worse.

## 2017-09-23 NOTE — ED Provider Notes (Signed)
[name]    CSN: [id number] Arrival date & time: 09/23/17  1417     History   Chief Complaint Chief Complaint  Patient presents with  . Cough  . Nasal Congestion    HPI  Robin Henry is a 61 y.o. female.   61 year old female with history of laryngopharyngeal reflux, macular dystrophy, subclinical hypothyroidism comes in with one-month history of URI symptoms.  She has had continued productive cough, congestion, fever, chills, fatigue, loss of appetite.  She reports she was feeling better at one point, but now worse.  She denies chest pain, shortness of breath.  She has noticed occasional wheezing.  Has been taking over-the-counter Zyrtec and Mucinex with little relief.  Today during work, she felt slightly dizzy, and was given 1 L of fluid, and she took a dose of Omnicef.  She denies history of asthma, COPD.  Never smoker.  Denies unilateral leg swelling, birth control use, immobilization.      Past Medical History:  Diagnosis Date  . Hoarseness 2018   with cough; laryngoscopy by Dr. Wilburn Cornelia showed all normal except postglottic erythema c/w reflux.  Pt started on PPI q 5 PM.  If cough persists, consider referral to [facility].  . Laryngopharyngeal reflux (LPR) 11/2016  . Macular dystrophy    Hx of detached retina (Dr. Baird Cancer)  . Osteoarthritis, multiple sites    Primarily knees  . Subclinical hypothyroidism     There are no active problems to display for this patient.   Past Surgical History:  Procedure Laterality Date  . ABDOMINAL HYSTERECTOMY  2001   fibroids  . COLONOSCOPY  09/08/2012   Normal.  Recall 10 yrs.  Procedure: COLONOSCOPY;  Surgeon: Jeryl Columbia, MD;  Location: [facility];  Service: Endoscopy;  Laterality: N/A;  wants no sedation-but start iv    OB History    No data available       Home Medications    Prior to Admission medications   Medication Sig Start Date End Date Taking? Authorizing Provider  albuterol  (PROVENTIL HFA;VENTOLIN HFA) 108 (90 Base) MCG/ACT inhaler Inhale 1-2 puffs into the lungs every 6 (six) hours as needed for wheezing or shortness of breath. 09/23/17   Tasia Catchings, Amy V, PA-C  Calcium-Vitamin D 600-200 MG-UNIT tablet Take 2 tablets by mouth daily.     [provider]  cefdinir (OMNICEF) 300 MG capsule Take 1 capsule (300 mg total) by mouth 2 (two) times daily for 5 days. 09/23/17 09/28/17  Ok Edwards, PA-C  Coenzyme Q10 200 MG capsule Take 200 mg by mouth 2 times daily at 12 noon and 4 pm.    [provider]  fish oil-omega-3 fatty acids 1000 MG capsule Take 2 g by mouth daily.      [provider]  ipratropium (ATROVENT) 0.06 % nasal spray Place 2 sprays into both nostrils 4 (four) times daily. 09/21/16   Lysbeth Penner, FNP  Multiple Vitamins-Minerals (VISION-VITE PRESERVE PO) Take by mouth daily.      [provider]  Nutritional Supplements (CHOLESTEROL DEFENSE PO) Take 900 mg elemental calcium/kg/hr by mouth 2 (two) times daily.    [provider]  VITAMIN D, CHOLECALCIFEROL, PO Take 500 mg by mouth 1 day or 1 dose.    [provider]    Family History Family History  Problem Relation Age of Onset  . Hypertension Mother   . Emphysema Mother   . Cancer Mother  cartoid  . Colon cancer Paternal Grandfather   . Alcohol abuse Sister   . Alcohol abuse Maternal Grandmother   . Lung cancer Maternal Grandmother     Social History Social History   Tobacco Use  . Smoking status: Never Smoker  . Smokeless tobacco: Never Used  Substance Use Topics  . Alcohol use: Yes    Comment: rare  . Drug use: No     Allergies   Patient has no known allergies.   Review of Systems Review of Systems  Reason unable to perform ROS: See HPI as above.     Physical Exam Triage Vital Signs ED Triage Vitals [09/23/17 1437]  Enc Vitals Group     BP 113/74     Pulse Rate (!) 103     Resp 18     Temp 97.7 F (36.5 C)      Temp src      SpO2 93 %     Weight      Height      Head Circumference      Peak Flow      Pain Score      Pain Loc      Pain Edu?      Excl. in [place]?    Orthostatic VS for the past 24 hrs:  BP- Lying Pulse- Lying BP- Sitting Pulse- Sitting BP- Standing at 0 minutes Pulse- Standing at 0 minutes  09/23/17 1601 109/73 86 110/78 82 106/74 96    Updated Vital Signs BP 113/74   Pulse (!) 103   Temp 97.7 F (36.5 C)   Resp 18   SpO2 95%   Physical Exam  Constitutional: She is oriented to person, place, and time. She appears well-developed and well-nourished. No distress.  HENT:  Head: Normocephalic and atraumatic.  Right Ear: Tympanic membrane, external ear and ear canal normal. Tympanic membrane is not erythematous and not bulging.  Left Ear: Tympanic membrane, external ear and ear canal normal. Tympanic membrane is not erythematous and not bulging.  Nose: Nose normal. Right sinus exhibits no maxillary sinus tenderness and no frontal sinus tenderness. Left sinus exhibits no maxillary sinus tenderness and no frontal sinus tenderness.  Mouth/Throat: Uvula is midline, oropharynx is clear and moist and mucous membranes are normal.  Eyes: Conjunctivae are normal. Pupils are equal, round, and reactive to light.  Neck: Normal range of motion. Neck supple.  Cardiovascular: Normal rate, regular rhythm and normal heart sounds. Exam reveals no gallop and no friction rub.  No murmur heard. Pulmonary/Chest: Effort normal and breath sounds normal. She has no decreased breath sounds. She has no wheezes. She has no rhonchi. She has no rales.  Lymphadenopathy:    She has no cervical adenopathy.  Neurological: She is alert and oriented to person, place, and time.  Skin: Skin is warm and dry.  Psychiatric: She has a normal mood and affect. Her behavior is normal. Judgment normal.     UC Treatments / Results  Labs (all labs ordered are listed, but only abnormal results are displayed) Labs  Reviewed - No data to display  EKG  EKG Interpretation None       Radiology Dg Chest 2 View  Result Date: 09/23/2017 CLINICAL DATA:  Per pt: sick for over a month, cough, productive sputum, was green, now clear AND white, no fever, head AND chest congestion, wheezing. No history of cardiac or respiratory disease. Non-smoker. No HBP, no diabetes. EXAM: CHEST  2 VIEW COMPARISON:  11/29/2008 FINDINGS:  Heart size is normal. There are no focal consolidations or pleural effusions. No pulmonary edema. IMPRESSION: No active cardiopulmonary disease. Electronically Signed   By: Nolon Nations M.D.   On: 09/23/2017 15:04    Procedures Procedures (including critical care time)  Medications Ordered in UC Medications  AEROCHAMBER PLUS FLO-VU MEDIUM MISC 1 each (1 each Other Given 09/23/17 1615)     Initial Impression / Assessment and Plan / UC Course  I have reviewed the triage vital signs and the nursing notes.  Pertinent labs & imaging results that were available during my care of the patient were reviewed by me and considered in my medical decision making (see chart for details).    Discussed case with Dr. Valere Dross.  61 year old female with no history of lung disease comes in for 1 month history of URI symptoms with productive cough.  She was found to be slightly tachycardic, hypoxic at 93%.  Chest x-ray without active cardiopulmonary disease.  Patient without chest pain, shortness of breath, no risk factors for PE.  Given patient with history of dizziness this morning, with 1 L fluid given, will check orthostatics.  Normal orthostatics with O2 sat 93-95%.  Discussed with patient concerns for possible sepsis with slight tachycardia, hypoxia, subjective fever.  Discuss antibiotic treatment with close monitoring versus further evaluation and emergency department.  Patient would like to proceed with antibiotic treatment, with close monitoring.  Start [place] as directed.  Given hypoxia with  subjective wheezing at home, will provide albuterol inhaler as needed.  Strict return precautions given.  Patient expresses understanding and agrees to plan.  Final Clinical Impressions(s) / UC Diagnoses   Final diagnoses:  Bronchitis    ED Discharge Orders        Ordered    cefdinir (OMNICEF) 300 MG capsule  2 times daily     09/23/17 1608    albuterol (PROVENTIL HFA;VENTOLIN HFA) 108 (90 Base) MCG/ACT inhaler  Every 6 hours PRN     09/23/17 1608       Ok Edwards, PA-C 09/23/17 1859

## 2017-09-23 NOTE — Discharge Instructions (Signed)
Chest xray negative for pneumonia. Start omnicef as directed. Albuterol as needed for shortness of breath/wheezing. As discussed, concerns for continued infection causing sepsis. Please monitor closely and go to the emergency department if symptoms worsens. Your O2 sat was low today at 93-95%, please follow up with PCP for recheck.

## 2017-10-14 ENCOUNTER — Encounter: Payer: Self-pay | Admitting: Pulmonary Disease

## 2017-10-14 ENCOUNTER — Ambulatory Visit: Payer: 59 | Admitting: Pulmonary Disease

## 2017-10-14 VITALS — BP 94/64 | HR 64 | Ht 67.0 in | Wt 141.0 lb

## 2017-10-14 DIAGNOSIS — R05 Cough: Secondary | ICD-10-CM | POA: Diagnosis not present

## 2017-10-14 DIAGNOSIS — R059 Cough, unspecified: Secondary | ICD-10-CM

## 2017-10-14 NOTE — Progress Notes (Signed)
Robin Henry    [id number]    27-Feb-1956  Primary Care Physician:Kuneff, Reinaldo Raddle, DO  Referring Physician: Ma Hillock, DO [address] [place], [place] [address]  Chief complaint: Cough, bronchitis  HPI: 62 year old with history of seasonal allergies, LPR, osteoarthritis, subclinical hypothyroidism, macular dystrophy.  Had complaints of cough with green mucus, wheezing.  She works as a Immunologist and almost passed out in the [facility] and had to lie down on the floor to keep from fainting.  She was evaluated in the ED on 09/23/17 with a chest x-ray that is clear.  Treated with Omnicef which was completed on 09/30/17.  She reports slowly improving symptoms over the past week.  Still has some cough with clear mucus.  Denies any fevers, chills, dyspnea, wheezing. Eval by ENT in February 2018 for chronic cough.  Noted to have posterior glottic erythema consistent with LPR.  She was started on PPI with improvement in symptoms.  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  No other significant travel history.  Outpatient Encounter Medications as of 10/14/2017  Medication Sig  . albuterol (PROVENTIL HFA;VENTOLIN HFA) 108 (90 Base) MCG/ACT inhaler Inhale 1-2 puffs into the lungs every 6 (six) hours as needed for wheezing or shortness of breath.  . Calcium-Vitamin D 600-200 MG-UNIT tablet Take 2 tablets by mouth daily.   . fish oil-omega-3 fatty acids 1000 MG capsule Take 2 g by mouth daily.    . Multiple Vitamins-Minerals (VISION-VITE PRESERVE PO) Take by mouth daily.    Marland Kitchen VITAMIN D, CHOLECALCIFEROL, PO Take 500 mg by mouth 1 day or 1 dose.  Marland Kitchen ipratropium (ATROVENT) 0.06 % nasal spray Place 2 sprays into both nostrils 4 (four) times daily. (Patient not taking: Reported on 10/14/2017)  . [DISCONTINUED] Coenzyme Q10 200 MG capsule  Take 200 mg by mouth 2 times daily at 12 noon and 4 pm.  . [DISCONTINUED] Nutritional Supplements (CHOLESTEROL DEFENSE PO) Take 900 mg elemental calcium/kg/hr by mouth 2 (two) times daily.   No facility-administered encounter medications on file as of 10/14/2017.     Allergies as of 10/14/2017  . (No Known Allergies)    Past Medical History:  Diagnosis Date  . Hoarseness 2018   with cough; laryngoscopy by Dr. Wilburn Cornelia showed all normal except postglottic erythema c/w reflux.  Pt started on PPI q 5 PM.  If cough persists, consider referral to [facility].  . Laryngopharyngeal reflux (LPR) 11/2016  . Macular dystrophy    Hx of detached retina (Dr. Baird Cancer)  . Osteoarthritis, multiple sites    Primarily knees    Past Surgical History:  Procedure Laterality Date  . ABDOMINAL HYSTERECTOMY  2001   fibroids  . COLONOSCOPY  09/08/2012   Normal.  Recall 10 yrs.  Procedure: COLONOSCOPY;  Surgeon: Jeryl Columbia, MD;  Location: [facility];  Service: Endoscopy;  Laterality: N/A;  wants no sedation-but start iv    Family History  Problem Relation Age of Onset  . Hypertension Mother   . Emphysema Mother   . Cancer Mother        cartoid  . Colon cancer Paternal Grandfather   . Alcohol abuse Sister   . Alcohol abuse Maternal Grandmother   . Lung cancer Maternal Grandmother     Social History  Socioeconomic History  . Marital status: Single    Spouse name: Not on file  . Number of children: Not on file  . Years of education: Not on file  . Highest education level: Not on file  Social Needs  . Financial resource strain: Not on file  . Food insecurity - worry: Not on file  . Food insecurity - inability: Not on file  . Transportation needs - medical: Not on file  . Transportation needs - non-medical: Not on file  Occupational History  . Not on file  Tobacco Use  . Smoking status: Never Smoker  . Smokeless tobacco: Never Used  Substance and Sexual Activity  . Alcohol  use: Yes    Comment: rare  . Drug use: No  . Sexual activity: Yes  Other Topics Concern  . Not on file  Social History Narrative   Widow, no children.   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  [place] resident since 1989.   Occup: CRNA   No T/A/Ds.   Exercises >3 X/week.    Review of systems: Review of Systems  Constitutional: Negative for fever and chills.  HENT: Negative.   Eyes: Negative for blurred vision.  Respiratory: as per HPI  Cardiovascular: Negative for chest pain and palpitations.  Gastrointestinal: Negative for vomiting, diarrhea, blood per rectum. Genitourinary: Negative for dysuria, urgency, frequency and hematuria.  Musculoskeletal: Negative for myalgias, back pain and joint pain.  Skin: Negative for itching and rash.  Neurological: Negative for dizziness, tremors, focal weakness, seizures and loss of consciousness.  Endo/Heme/Allergies: Negative for environmental allergies.  Psychiatric/Behavioral: Negative for depression, suicidal ideas and hallucinations.  All other systems reviewed and are negative.  Physical Exam: Blood pressure 94/64, pulse 64, height 5\' 7"  (1.702 m), weight 141 lb (64 kg), SpO2 96 %. Gen:      No acute distress HEENT:  EOMI, sclera anicteric Neck:     No masses; no thyromegaly Lungs:    Clear to auscultation bilaterally; normal respiratory effort CV:         Regular rate and rhythm; no murmurs Abd:      + bowel sounds; soft, non-tender; no palpable masses, no distension Ext:    No edema; adequate peripheral perfusion Skin:      Warm and dry; no rash Neuro: alert and oriented x 3 Psych: normal mood and affect  Data Reviewed: Chest x-ray 09/23/17-hyperinflation, lung fields are clear.  I reviewed the images personally.  Assessment:  Acute bronchitis Appears to be improving after antibiotic treatment.  Chest x-ray reviewed with no infiltrate or pneumonia. Lungs are clear on examination today. She will not require another course of  antibiotics or prednisone  Acid reflux, laryngopharyngeal reflux PPI as needed.   Plan/Recommendations: - Follow up as needed  Marshell Garfinkel MD  Pulmonary and Critical Care Pager [phone] 10/14/2017, 4:34 PM  CC: Raoul Pitch, Renee A, DO

## 2017-10-14 NOTE — Patient Instructions (Signed)
I am glad that your symptoms are improving I feel at this point you do not require additional antibiotic or prednisone.  However if your symptoms recur then give [place] a call for reevaluation Follow-up as needed.

## 2017-10-31 MED FILL — CHLORHEXIDINE 0.12% RINSE: 0.12 | 16 days supply | Qty: 473 | Fill #0

## 2017-10-31 MED FILL — AMOXICILLIN 500 MG CAPSULE: 500 | 7 days supply | Qty: 21 | Fill #0

## 2017-10-31 MED FILL — IBUPROFEN 800 MG TAB: 800 | 5 days supply | Qty: 15 | Fill #0

## 2017-11-07 ENCOUNTER — Other Ambulatory Visit: Payer: Self-pay | Admitting: Specialist

## 2017-11-07 DIAGNOSIS — Z1231 Encounter for screening mammogram for malignant neoplasm of breast: Secondary | ICD-10-CM

## 2017-11-08 ENCOUNTER — Ambulatory Visit
Admission: RE | Admit: 2017-11-08 | Discharge: 2017-11-08 | Disposition: A | Discharge status: Home / Self Care | Payer: 59 | Source: Ambulatory Visit | Attending: Specialist | Admitting: Specialist

## 2017-11-08 DIAGNOSIS — Z1231 Encounter for screening mammogram for malignant neoplasm of breast: Secondary | ICD-10-CM

## 2018-02-03 ENCOUNTER — Telehealth: Payer: Self-pay | Admitting: *Deleted

## 2018-02-03 DIAGNOSIS — E559 Vitamin D deficiency, unspecified: Secondary | ICD-10-CM | POA: Insufficient documentation

## 2018-02-03 DIAGNOSIS — Z130 Encounter for screening for diseases of the blood and blood-forming organs and certain disorders involving the immune mechanism: Secondary | ICD-10-CM

## 2018-02-03 DIAGNOSIS — Z13220 Encounter for screening for lipoid disorders: Secondary | ICD-10-CM

## 2018-02-03 DIAGNOSIS — Z131 Encounter for screening for diabetes mellitus: Secondary | ICD-10-CM

## 2018-02-03 DIAGNOSIS — Z79899 Other long term (current) drug therapy: Secondary | ICD-10-CM

## 2018-02-03 NOTE — Telephone Encounter (Signed)
Copied from [place] [id number]. Topic: Appointment Scheduling - Scheduling Inquiry for Clinic >> Feb 03, 2018  2:37 PM Aurelio Brash B wrote: Reason for CRM: PT is schedule for cpe 5/20  she wants to know if she can get her labs ahead of time at [employer] [facility] where she works

## 2018-02-03 NOTE — Telephone Encounter (Signed)
Please order CBC, BMP, a1c, lipid panel and Vit D to be collected at [facility]. -She should be offered the hepatitis C screening and HIV screening, if she desires.  If she does desire that to her lab work as well. -We will need to use screening codes for diagnosis with the exception of vitamin D; she has had vitamin D deficiency.

## 2018-02-06 ENCOUNTER — Encounter: Payer: Self-pay | Admitting: *Deleted

## 2018-02-06 NOTE — Telephone Encounter (Signed)
Sent MY Chart message to patient to see if she wants to be screened for Hep C or HIV.

## 2018-02-06 NOTE — Telephone Encounter (Signed)
Future order for labs to be done at [facility] placed.

## 2018-02-20 ENCOUNTER — Encounter: Payer: Self-pay | Admitting: Family Medicine

## 2018-02-20 ENCOUNTER — Ambulatory Visit (INDEPENDENT_AMBULATORY_CARE_PROVIDER_SITE_OTHER): Payer: 59 | Admitting: Family Medicine

## 2018-02-20 ENCOUNTER — Encounter: Payer: 59 | Admitting: Family Medicine

## 2018-02-20 ENCOUNTER — Other Ambulatory Visit (INDEPENDENT_AMBULATORY_CARE_PROVIDER_SITE_OTHER): Payer: 59

## 2018-02-20 VITALS — BP 119/85 | HR 82 | Temp 98.0°F | Resp 20 | Ht 67.0 in | Wt 139.5 lb

## 2018-02-20 DIAGNOSIS — Z0000 Encounter for general adult medical examination without abnormal findings: Secondary | ICD-10-CM | POA: Diagnosis not present

## 2018-02-20 DIAGNOSIS — E559 Vitamin D deficiency, unspecified: Secondary | ICD-10-CM | POA: Diagnosis not present

## 2018-02-20 DIAGNOSIS — Z13220 Encounter for screening for lipoid disorders: Secondary | ICD-10-CM

## 2018-02-20 DIAGNOSIS — Z1159 Encounter for screening for other viral diseases: Secondary | ICD-10-CM | POA: Diagnosis not present

## 2018-02-20 DIAGNOSIS — Z1211 Encounter for screening for malignant neoplasm of colon: Secondary | ICD-10-CM | POA: Diagnosis not present

## 2018-02-20 DIAGNOSIS — Z114 Encounter for screening for human immunodeficiency virus [HIV]: Secondary | ICD-10-CM | POA: Diagnosis not present

## 2018-02-20 DIAGNOSIS — Z131 Encounter for screening for diabetes mellitus: Secondary | ICD-10-CM | POA: Diagnosis not present

## 2018-02-20 DIAGNOSIS — Z130 Encounter for screening for diseases of the blood and blood-forming organs and certain disorders involving the immune mechanism: Secondary | ICD-10-CM

## 2018-02-20 DIAGNOSIS — Z79899 Other long term (current) drug therapy: Secondary | ICD-10-CM | POA: Diagnosis not present

## 2018-02-20 LAB — CBC WITH DIFFERENTIAL/PLATELET
BASOS ABS: 0.1 10*3/uL (ref 0.0–0.1)
Basophils Relative: 1.2 % (ref 0.0–3.0)
EOS ABS: 0.1 10*3/uL (ref 0.0–0.7)
Eosinophils Relative: 2.2 % (ref 0.0–5.0)
HEMATOCRIT: 40.9 % (ref 36.0–46.0)
HEMOGLOBIN: 13.8 g/dL (ref 12.0–15.0)
LYMPHS PCT: 28.2 % (ref 12.0–46.0)
Lymphs Abs: 1.6 10*3/uL (ref 0.7–4.0)
MCHC: 33.8 g/dL (ref 30.0–36.0)
MCV: 87.2 fl (ref 78.0–100.0)
MONO ABS: 0.3 10*3/uL (ref 0.1–1.0)
Monocytes Relative: 6.1 % (ref 3.0–12.0)
Neutro Abs: 3.4 10*3/uL (ref 1.4–7.7)
Neutrophils Relative %: 62.3 % (ref 43.0–77.0)
Platelets: 277 10*3/uL (ref 150.0–400.0)
RBC: 4.69 Mil/uL (ref 3.87–5.11)
RDW: 13.1 % (ref 11.5–15.5)
WBC: 5.5 10*3/uL (ref 4.0–10.5)

## 2018-02-20 LAB — LIPID PANEL
CHOLESTEROL: 206 mg/dL — AB (ref 0–200)
HDL: 61.7 mg/dL (ref >39.00)
LDL Cholesterol: 133 mg/dL — ABNORMAL HIGH (ref 0–99)
NonHDL: 144.48
Total CHOL/HDL Ratio: 3
Triglycerides: 59 mg/dL (ref 0.0–149.0)
VLDL: 11.8 mg/dL (ref 0.0–40.0)

## 2018-02-20 LAB — VITAMIN D 25 HYDROXY (VIT D DEFICIENCY, FRACTURES): VITD: 40.75 ng/mL (ref 30.00–100.00)

## 2018-02-20 LAB — COMPREHENSIVE METABOLIC PANEL
ALBUMIN: 4.3 g/dL (ref 3.5–5.2)
ALT: 13 U/L (ref 0–35)
AST: 13 U/L (ref 0–37)
Alkaline Phosphatase: 67 U/L (ref 39–117)
BILIRUBIN TOTAL: 0.9 mg/dL (ref 0.2–1.2)
BUN: 15 mg/dL (ref 6–23)
CO2: 30 meq/L (ref 19–32)
CREATININE: 0.78 mg/dL (ref 0.40–1.20)
Calcium: 9.4 mg/dL (ref 8.4–10.5)
Chloride: 104 mEq/L (ref 96–112)
GFR: 79.28 mL/min (ref >60.00)
Glucose, Bld: 82 mg/dL (ref 70–99)
Potassium: 4.3 mEq/L (ref 3.5–5.1)
SODIUM: 141 meq/L (ref 135–145)
Total Protein: 6.9 g/dL (ref 6.0–8.3)

## 2018-02-20 LAB — HEMOGLOBIN A1C: Hgb A1c MFr Bld: 5.7 % (ref 4.6–6.5)

## 2018-02-20 MED ORDER — ZOSTER VAC RECOMB ADJUVANTED 50 MCG/0.5ML IM SUSR: INTRAMUSCULAR | 1 refills | Status: DC

## 2018-02-20 NOTE — Patient Instructions (Addendum)
Health Maintenance, Female Adopting a healthy lifestyle and getting preventive care can go a long way to promote health and wellness. Talk with your health care provider about what schedule of regular examinations is right for you. This is a good chance for you to check in with your provider about disease prevention and staying healthy. In between checkups, there are plenty of things you can do on your own. Experts have done a lot of research about which lifestyle changes and preventive measures are most likely to keep you healthy. Ask your health care provider for more information. Weight and diet Eat a healthy diet  Be sure to include plenty of vegetables, fruits, low-fat dairy products, and lean protein.  Do not eat a lot of foods high in solid fats, added sugars, or salt.  Get regular exercise. This is one of the most important things you can do for your health. ? Most adults should exercise for at least 150 minutes each week. The exercise should increase your heart rate and make you sweat (moderate-intensity exercise). ? Most adults should also do strengthening exercises at least twice a week. This is in addition to the moderate-intensity exercise.  Maintain a healthy weight  Body mass index (BMI) is a measurement that can be used to identify possible weight problems. It estimates body fat based on height and weight. Your health care provider can help determine your BMI and help you achieve or maintain a healthy weight.  For females 20 years of age and older: ? A BMI below 18.5 is considered underweight. ? A BMI of 18.5 to 24.9 is normal. ? A BMI of 25 to 29.9 is considered overweight. ? A BMI of 30 and above is considered obese.  Watch levels of cholesterol and blood lipids  You should start having your blood tested for lipids and cholesterol at 63 years of age, then have this test every 5 years.  You may need to have your cholesterol levels checked more often if: ? Your lipid or  cholesterol levels are high. ? You are older than 63 years of age. ? You are at high risk for heart disease.  Cancer screening Lung Cancer  Lung cancer screening is recommended for adults 55-80 years old who are at high risk for lung cancer because of a history of smoking.  A yearly low-dose CT scan of the lungs is recommended for people who: ? Currently smoke. ? Have quit within the past 15 years. ? Have at least a 30-pack-year history of smoking. A pack year is smoking an average of one pack of cigarettes a day for 1 year.  Yearly screening should continue until it has been 15 years since you quit.  Yearly screening should stop if you develop a health problem that would prevent you from having lung cancer treatment.  Breast Cancer  Practice breast self-awareness. This means understanding how your breasts normally appear and feel.  It also means doing regular breast self-exams. Let your health care provider know about any changes, no matter how small.  If you are in your 20s or 30s, you should have a clinical breast exam (CBE) by a health care provider every 1-3 years as part of a regular health exam.  If you are 40 or older, have a CBE every year. Also consider having a breast X-ray (mammogram) every year.  If you have a family history of breast cancer, talk to your health care provider about genetic screening.  If you are at high risk   for breast cancer, talk to your health care provider about having an MRI and a mammogram every year.  Breast cancer gene (BRCA) assessment is recommended for women who have family members with BRCA-related cancers. BRCA-related cancers include: ? Breast. ? Ovarian. ? Tubal. ? Peritoneal cancers.  Results of the assessment will determine the need for genetic counseling and BRCA1 and BRCA2 testing.  Cervical Cancer Your health care provider may recommend that you be screened regularly for cancer of the pelvic organs (ovaries, uterus, and  vagina). This screening involves a pelvic examination, including checking for microscopic changes to the surface of your cervix (Pap test). You may be encouraged to have this screening done every 3 years, beginning at age 22.  For women ages 56-65, health care providers may recommend pelvic exams and Pap testing every 3 years, or they may recommend the Pap and pelvic exam, combined with testing for human papilloma virus (HPV), every 5 years. Some types of HPV increase your risk of cervical cancer. Testing for HPV may also be done on women of any age with unclear Pap test results.  Other health care providers may not recommend any screening for nonpregnant women who are considered low risk for pelvic cancer and who do not have symptoms. Ask your health care provider if a screening pelvic exam is right for you.  If you have had past treatment for cervical cancer or a condition that could lead to cancer, you need Pap tests and screening for cancer for at least 20 years after your treatment. If Pap tests have been discontinued, your risk factors (such as having a new sexual partner) need to be reassessed to determine if screening should resume. Some women have medical problems that increase the chance of getting cervical cancer. In these cases, your health care provider may recommend more frequent screening and Pap tests.  Colorectal Cancer  This type of cancer can be detected and often prevented.  Routine colorectal cancer screening usually begins at 63 years of age and continues through 63 years of age.  Your health care provider may recommend screening at an earlier age if you have risk factors for colon cancer.  Your health care provider may also recommend using home test kits to check for hidden blood in the stool.  A small camera at the end of a tube can be used to examine your colon directly (sigmoidoscopy or colonoscopy). This is done to check for the earliest forms of colorectal  cancer.  Routine screening usually begins at age 33.  Direct examination of the colon should be repeated every 5-10 years through 63 years of age. However, you may need to be screened more often if early forms of precancerous polyps or small growths are found.  Skin Cancer  Check your skin from head to toe regularly.  Tell your health care provider about any new moles or changes in moles, especially if there is a change in a mole's shape or color.  Also tell your health care provider if you have a mole that is larger than the size of a pencil eraser.  Always use sunscreen. Apply sunscreen liberally and repeatedly throughout the day.  Protect yourself by wearing long sleeves, pants, a wide-brimmed hat, and sunglasses whenever you are outside.  Heart disease, diabetes, and high blood pressure  High blood pressure causes heart disease and increases the risk of stroke. High blood pressure is more likely to develop in: ? People who have blood pressure in the high end of  the normal range (130-139/85-89 mm Hg). ? People who are overweight or obese. ? People who are African American.  If you are 21-29 years of age, have your blood pressure checked every 3-5 years. If you are 3 years of age or older, have your blood pressure checked every year. You should have your blood pressure measured twice-once when you are at a hospital or clinic, and once when you are not at a hospital or clinic. Record the average of the two measurements. To check your blood pressure when you are not at a hospital or clinic, you can use: ? An automated blood pressure machine at a pharmacy. ? A home blood pressure monitor.  If you are between 17 years and 37 years old, ask your health care provider if you should take aspirin to prevent strokes.  Have regular diabetes screenings. This involves taking a blood sample to check your fasting blood sugar level. ? If you are at a normal weight and have a low risk for diabetes,  have this test once every three years after 63 years of age. ? If you are overweight and have a high risk for diabetes, consider being tested at a younger age or more often. Preventing infection Hepatitis B  If you have a higher risk for hepatitis B, you should be screened for this virus. You are considered at high risk for hepatitis B if: ? You were born in a country where hepatitis B is common. Ask your health care provider which countries are considered high risk. ? Your parents were born in a high-risk country, and you have not been immunized against hepatitis B (hepatitis B vaccine). ? You have HIV or AIDS. ? You use needles to inject street drugs. ? You live with someone who has hepatitis B. ? You have had sex with someone who has hepatitis B. ? You get hemodialysis treatment. ? You take certain medicines for conditions, including cancer, organ transplantation, and autoimmune conditions.  Hepatitis C  Blood testing is recommended for: ? Everyone born from [birth year] through 1966. ? Anyone with known risk factors for hepatitis C.  Sexually transmitted infections (STIs)  You should be screened for sexually transmitted infections (STIs) including gonorrhea and chlamydia if: ? You are sexually active and are younger than 63 years of age. ? You are older than 63 years of age and your health care provider tells you that you are at risk for this type of infection. ? Your sexual activity has changed since you were last screened and you are at an increased risk for chlamydia or gonorrhea. Ask your health care provider if you are at risk.  If you do not have HIV, but are at risk, it may be recommended that you take a prescription medicine daily to prevent HIV infection. This is called pre-exposure prophylaxis (PrEP). You are considered at risk if: ? You are sexually active and do not regularly use condoms or know the HIV status of your partner(s). ? You take drugs by injection. ? You are  sexually active with a partner who has HIV.  Talk with your health care provider about whether you are at high risk of being infected with HIV. If you choose to begin PrEP, you should first be tested for HIV. You should then be tested every 3 months for as long as you are taking PrEP. Pregnancy  If you are premenopausal and you may become pregnant, ask your health care provider about preconception counseling.  If you may become  pregnant, take 400 to 800 micrograms (mcg) of folic acid every day.  If you want to prevent pregnancy, talk to your health care provider about birth control (contraception). Osteoporosis and menopause  Osteoporosis is a disease in which the bones lose minerals and strength with aging. This can result in serious bone fractures. Your risk for osteoporosis can be identified using a bone density scan.  If you are 65 years of age or older, or if you are at risk for osteoporosis and fractures, ask your health care provider if you should be screened.  Ask your health care provider whether you should take a calcium or vitamin D supplement to lower your risk for osteoporosis.  Menopause may have certain physical symptoms and risks.  Hormone replacement therapy may reduce some of these symptoms and risks. Talk to your health care provider about whether hormone replacement therapy is right for you. Follow these instructions at home:  Schedule regular health, dental, and eye exams.  Stay current with your immunizations.  Do not use any tobacco products including cigarettes, chewing tobacco, or electronic cigarettes.  If you are pregnant, do not drink alcohol.  If you are breastfeeding, limit how much and how often you drink alcohol.  Limit alcohol intake to no more than 1 drink per day for nonpregnant women. One drink equals 12 ounces of beer, 5 ounces of wine, or 1 ounces of hard liquor.  Do not use street drugs.  Do not share needles.  Ask your health care  provider for help if you need support or information about quitting drugs.  Tell your health care provider if you often feel depressed.  Tell your health care provider if you have ever been abused or do not feel safe at home. This information is not intended to replace advice given to you by your health care provider. Make sure you discuss any questions you have with your health care provider. Document Released: 04/05/2011 Document Revised: 02/26/2016 Document Reviewed: 06/24/2015 Elsevier Interactive Patient Education  2018 [employer]  Please help us help you:  We are honored you have chosen [facility] for your Primary Care home. Below you will find basic instructions that you may need to access in the future. Please help us help you by reading the instructions, which cover many of the frequent questions we experience.   Prescription refills and request:  -In order to allow more efficient response time, please call your pharmacy for all refills. They will forward the request electronically to us. This allows for the quickest possible response. Request left on a nurse line can take longer to refill, since these are checked as time allows between office patients and other phone calls.  - refill request can take up to 3-5 working days to complete.  - If request is sent electronically and request is appropiate, it is usually completed in 1-2 business days.  - all patients will need to be seen routinely for all chronic medical conditions requiring prescription medications (see follow-up below). If you are overdue for follow up on your condition, you will be asked to make an appointment and we will call in enough medication to cover you until your appointment (up to 30 days).  - all controlled substances will require a face to face visit to request/refill.  - if you desire your prescriptions to go through a new pharmacy, and have an active script at original pharmacy, you will need to call  your pharmacy and have scripts transferred to new   pharmacy. This is completed between the pharmacy locations and not by your provider.    Results: If any images or labs were ordered, it can take up to 1 week to get results depending on the test ordered and the lab/facility running and resulting the test. - Normal or stable results, which do not need further discussion, may be released to your mychart immediately with attached note to you. A call may not be generated for normal results. Please make certain to sign up for mychart. If you have questions on how to activate your mychart you can call the front office.  - If your results need further discussion, our office will attempt to contact you via phone, and if unable to reach you after 2 attempts, we will release your abnormal result to your mychart with instructions.  - All results will be automatically released in mychart after 1 week.  - Your provider will provide you with explanation and instruction on all relevant material in your results. Please keep in mind, results and labs may appear confusing or abnormal to the untrained eye, but it does not mean they are actually abnormal for you personally. If you have any questions about your results that are not covered, or you desire more detailed explanation than what was provided, you should make an appointment with your provider to do so.   Our office handles many outgoing and incoming calls daily. If we have not contacted you within 1 week about your results, please check your mychart to see if there is a message first and if not, then contact our office.  In helping with this matter, you help decrease call volume, and therefore allow us to be able to respond to patients needs more efficiently.   Acute office visits (sick visit):  An acute visit is intended for a new problem and are scheduled in shorter time slots to allow schedule openings for patients with new problems. This is the appropriate visit  to discuss a new problem. Problems will not be addressed by phone call or Echart message. Appointment is needed if requesting treatment. In order to provide you with excellent quality medical care with proper time for you to explain your problem, have an exam and receive treatment with instructions, these appointments should be limited to one new problem per visit. If you experience a new problem, in which you desire to be addressed, please make an acute office visit, we save openings on the schedule to accommodate you. Please do not save your new problem for any other type of visit, let us take care of it properly and quickly for you.   Follow up visits:  Depending on your condition(s) your provider will need to see you routinely in order to provide you with quality care and prescribe medication(s). Most chronic conditions (Example: hypertension, Diabetes, depression/anxiety... etc), require visits a couple times a year. Your provider will instruct you on proper follow up for your personal medical conditions and history. Please make certain to make follow up appointments for your condition as instructed. Failing to do so could result in lapse in your medication treatment/refills. If you request a refill, and are overdue to be seen on a condition, we will always provide you with a 30 day script (once) to allow you time to schedule.    Medicare wellness (well visit): - we have a wonderful Nurse (Kim), that will meet with you and provide you will yearly medicare wellness visits. These visits should occur yearly (can not   be scheduled less than 1 calendar year apart) and cover preventive health, immunizations, advance directives and screenings you are entitled to yearly through your medicare benefits. Do not miss out on your entitled benefits, this is when medicare will pay for these benefits to be ordered for you.  These are strongly encouraged by your provider and is the appropriate type of visit to make  certain you are up to date with all preventive health benefits. If you have not had your medicare wellness exam in the last 12 months, please make certain to schedule one by calling the office and schedule your medicare wellness with Kim as soon as possible.   Yearly physical (well visit):  - Adults are recommended to be seen yearly for physicals. Check with your insurance and date of your last physical, most insurances require one calendar year between physicals. Physicals include all preventive health topics, screenings, medical exam and labs that are appropriate for gender/age and history. You may have fasting labs needed at this visit. This is a well visit (not a sick visit), new problems should not be covered during this visit (see acute visit).  - Pediatric patients are seen more frequently when they are younger. Your provider will advise you on well child visit timing that is appropriate for your their age. - This is not a medicare wellness visit. Medicare wellness exams do not have an exam portion to the visit. Some medicare companies allow for a physical, some do not allow a yearly physical. If your medicare allows a yearly physical you can schedule the medicare wellness with our nurse Kim and have your physical with your provider after, on the same day. Please check with insurance for your full benefits.   Late Policy/No Shows:  - all new patients should arrive 15-30 minutes earlier than appointment to allow us time  to  obtain all personal demographics,  insurance information and for you to complete office paperwork. - All established patients should arrive 10-15 minutes earlier than appointment time to update all information and be checked in .  - In our best efforts to run on time, if you are late for your appointment you will be asked to either reschedule or if able, we will work you back into the schedule. There will be a wait time to work you back in the schedule,  depending on  availability.  - If you are unable to make it to your appointment as scheduled, please call 24 hours ahead of time to allow us to fill the time slot with someone else who needs to be seen. If you do not cancel your appointment ahead of time, you may be charged a no show fee.   

## 2018-02-20 NOTE — Progress Notes (Signed)
Patient ID: Robin Henry, female  DOB: 03-06-1956, 62 y.o.   MRN: [id number] Patient Care Team    Relationship Specialty Notifications Start End  Ma Hillock, DO PCP - General Family Medicine  10/29/16   Jerrell Belfast, MD Consulting Physician Otolaryngology  11/15/16     Chief Complaint  Patient presents with  . Annual Exam    Subjective:  Robin Henry is a 62 y.o.  Female  present for CPE. All past medical history, surgical history, allergies, family history, immunizations, medications and social history were updated in the electronic medical record today. All recent labs, ED visits and hospitalizations within the last year were reviewed.  Health maintenance:  Colonoscopy: completed/03/2012, by [facility], follow up 5 years. Established, will refer again so generates a call. Fhx colon cancer.  Mammogram: completed: 11/08/2017, [facility] Cervical cancer screening: Hysterectomy Immunizations: tdap UTD 2016, Influenza UTD (encouraged yearly),zostavax 11/14/2015. Shingrix printed.  Infectious disease screening: Hep C and HIV collected today DEXA: last completed 06/08/2011 Assistive device: none Oxygen use: none Patient has a Dental home. Hospitalizations/ED visits: reviewed  Depression screen [facility] 2/9 02/20/2018  Decreased Interest 0  Down, Depressed, Hopeless 0  PHQ - 2 Score 0   No flowsheet data found.   Immunization History  Administered Date(s) Administered  . Influenza-Unspecified 07/04/2017  . Tdap 04/25/2015  . Zoster 11/14/2015    Past Medical History:  Diagnosis Date  . Hoarseness 2018   with cough; laryngoscopy by Dr. Wilburn Cornelia showed all normal except postglottic erythema c/w reflux.  Pt started on PPI q 5 PM.  If cough persists, consider referral to [facility] cough [facility].  . Laryngopharyngeal reflux (LPR) 11/2016  . Macular dystrophy    Hx of detached retina (Dr. Baird Cancer)  . Osteoarthritis, multiple sites    Primarily knees   No Known  Allergies Past Surgical History:  Procedure Laterality Date  . ABDOMINAL HYSTERECTOMY  2001   fibroids  . COLONOSCOPY  09/08/2012   Normal.  Recall 10 yrs.  Procedure: COLONOSCOPY;  Surgeon: Jeryl Columbia, MD;  Location: [facility];  Service: Endoscopy;  Laterality: N/A;  wants no sedation-but start iv   Family History  Problem Relation Age of Onset  . Hypertension Mother   . Emphysema Mother   . Cancer Mother        cartoid  . Colon cancer Paternal Grandfather   . Alcohol abuse Sister   . Alcohol abuse Maternal Grandmother   . Lung cancer Maternal Grandmother    Social History   Socioeconomic History  . Marital status: Widowed    Spouse name: Not on file  . Number of children: Not on file  . Years of education: Not on file  . Highest education level: Not on file  Occupational History  . Not on file  Social Needs  . Financial resource strain: Not on file  . Food insecurity:    Worry: Not on file    Inability: Not on file  . Transportation needs:    Medical: Not on file    Non-medical: Not on file  Tobacco Use  . Smoking status: Never Smoker  . Smokeless tobacco: Never Used  Substance and Sexual Activity  . Alcohol use: Yes    Comment: rare  . Drug use: No  . Sexual activity: Yes  Lifestyle  . Physical activity:    Days per week: Not on file    Minutes per session: Not on file  .  Stress: Not on file  Relationships  . Social connections:    Talks on phone: Not on file    Gets together: Not on file    Attends religious service: Not on file    Active member of club or organization: Not on file    Attends meetings of clubs or organizations: Not on file    Relationship status: Not on file  . Intimate partner violence:    Fear of current or ex partner: Not on file    Emotionally abused: Not on file    Physically abused: Not on file    Forced sexual activity: Not on file  Other Topics Concern  . Not on file  Social History Narrative   Widow, no children.    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  [place] resident since 1989.   Occup: CRNA   No T/A/Ds.   Exercises >3 X/week.   Allergies as of 02/20/2018   No Known Allergies     Medication List        Accurate as of 02/20/18  3:19 PM. Always use your most recent med list.          Calcium-Vitamin D 600-200 MG-UNIT tablet Take 2 tablets by mouth daily.   fish oil-omega-3 fatty acids 1000 MG capsule Take 2 g by mouth daily.   VISION-VITE PRESERVE PO Take by mouth daily.   VITAMIN D (CHOLECALCIFEROL) PO Take 500 mg by mouth 1 day or 1 dose.       All past medical history, surgical history, allergies, family history, immunizations andmedications were updated in the EMR today and reviewed under the history and medication portions of their EMR.     No results found for this or any previous visit (from the past 2160 hour(s)).  Mm Screening Breast Tomo Bilateral  Result Date: 11/08/2017 CLINICAL DATA:  Screening. EXAM: 2D DIGITAL SCREENING BILATERAL MAMMOGRAM WITH 3D TOMO WITH CAD COMPARISON:  Previous exam(s). ACR Breast Density Category c: The breast tissue is heterogeneously dense, which may obscure small masses. FINDINGS: There are no findings suspicious for malignancy. Images were processed with CAD. IMPRESSION: No mammographic evidence of malignancy. A result letter of this screening mammogram will be mailed directly to the patient. RECOMMENDATION: Screening mammogram in one year. (Code:SM-B-01Y) BI-RADS CATEGORY  1: Negative. Electronically Signed   By: Lovey Newcomer M.D.   On: 11/08/2017 10:49     ROS: 14 pt review of systems performed and negative (unless mentioned in an HPI)  Objective: BP 119/85 (BP Location: Right Arm, Patient Position: Sitting, Cuff Size: Normal)   Pulse 82   Temp 98 F (36.7 C)   Resp 20   Ht 5\' 7"  (1.702 m)   Wt 139 lb 8 oz (63.3 kg)   SpO2 98%   BMI 21.85 kg/m  Gen: Afebrile. No acute distress. Nontoxic in appearance, well-developed, well-nourished,  Thin  caucasian female. HENT: AT. [place]. Bilateral TM visualized and normal in appearance, normal external auditory canal. MMM, no oral lesions, adequate dentition. Bilateral nares within normal limits. Throat without erythema, ulcerations or exudates. no Cough on exam, no hoarseness on exam. Eyes:Pupils Equal Round Reactive to light, Extraocular movements intact,  Conjunctiva without redness, discharge or icterus. Neck/lymp/endocrine: Supple,no lymphadenopathy, no thyromegaly CV: RRR no murmur, no edema, +2/4 P posterior tibialis pulses. no carotid bruits. No JVD. Chest: CTAB, no wheeze, rhonchi or crackles. normal Respiratory effort. good Air movement. Abd: Soft. flat. NTND. BS present. no Masses palpated. No hepatosplenomegaly. No rebound tenderness or guarding.  Skin: no rashes, purpura or petechiae. Warm and well-perfused. Skin intact. Neuro/Msk:  Normal gait. PERLA. EOMi. Alert. Oriented x3.  Cranial nerves II through XII intact. Muscle strength 5/5 upper/lower extremity. DTRs equal bilaterally. Psych: Normal affect, dress and demeanor. Normal speech. Normal thought content and judgment.  No exam data present  Assessment/plan: Robin Henry is a 62 y.o. female present for CPE. Vitamin D deficiency - vit D 900 u daily.  - vit d collected today Colon cancer screening Overdue for 5 year recall.  - Ambulatory referral to Gastroenterology Encounter for preventive health examination Patient was encouraged to exercise greater than 150 minutes a week. Patient was encouraged to choose a diet filled with fresh fruits and vegetables, and lean meats. AVS provided to patient today for education/recommendation on gender specific health and safety maintenance. Colonoscopy: completed/03/2012, by [facility], follow up 5 years. Established, will refer again so generates a call. Fhx colon cancer.  Mammogram: completed: 11/08/2017, [facility]. Breast center Cervical cancer screening: Hysterectomy Immunizations: tdap UTD  2016, Influenza UTD (encouraged yearly),zostavax 11/14/2015. Shingrix printed.  Infectious disease screening: Hep C and HIV collected today DEXA: last completed 06/08/2011 Labs collected pre-visit.   Return in about 1 year (around 02/21/2019) for CPE.  Electronically signed by: Howard Pouch, DO [place]

## 2018-02-21 LAB — HEPATITIS C ANTIBODY
Hepatitis C Ab: NONREACTIVE
SIGNAL TO CUT-OFF: 0.01 (ref <1.00)

## 2018-02-21 LAB — HIV ANTIBODY (ROUTINE TESTING W REFLEX): HIV: NONREACTIVE

## 2018-03-06 ENCOUNTER — Telehealth: Payer: Self-pay | Admitting: Family Medicine

## 2018-03-06 ENCOUNTER — Telehealth: Payer: 59 | Admitting: Family

## 2018-03-06 DIAGNOSIS — W57XXXA Bitten or stung by nonvenomous insect and other nonvenomous arthropods, initial encounter: Secondary | ICD-10-CM

## 2018-03-06 DIAGNOSIS — S80869A Insect bite (nonvenomous), unspecified lower leg, initial encounter: Secondary | ICD-10-CM | POA: Diagnosis not present

## 2018-03-06 MED ORDER — DOXYCYCLINE HYCLATE 100 MG PO TABS
100.0000 mg | ORAL_TABLET | Freq: Two times a day (BID) | ORAL | 0 refills | Status: DC
Start: 2018-03-06 — End: 2019-02-06

## 2018-03-06 NOTE — Telephone Encounter (Signed)
If patient desires treatment, she must be seen. I do not preform evists, but [facility] has this available to employees. She could try an e-vist if she does not wish to be seen.

## 2018-03-06 NOTE — Telephone Encounter (Signed)
Left message for patient she needs to be seen here or she can do an e-visit .

## 2018-03-06 NOTE — Telephone Encounter (Signed)
Copied from [place] [id number]. Topic: Appointment Scheduling - Prior Auth Required for Appointment >> Mar 06, 2018  1:10 PM Ahmed Prima L wrote: Patient states she was bitten by a tick over the weekend ( 5 tick bites ), patient refused appointment due to working 12 hours today and tomorrow and would like a prescription of " doxycyline " to be called in for her.   [place], [place]  Can not be reached by phone due to work, she said that she can be text. [phone]

## 2018-03-06 NOTE — Progress Notes (Signed)
Thank you for the details you included in the comment boxes. Those details are very helpful in determining the best course of treatment for you and help us to provide the best care.  Thank you for describing your tick bite, Here is how we plan to help! Based on the information that you shared with me it looks like you have An infected or complicated tick bite that requires a longer course of antibiotics and will need for you to schedule a follow-up visit with a provider.  In most cases a tick bite is painless and does not itch.  Most tick bites in which the tick is quickly removed do not require prescriptions. Ticks can transmit several diseases if they are infected and remain attacked to your skin. Therefore the length that the tick was attached and any symptoms you have experienced after the bite are import to accurately develop your custom treatment plan. In most cases a single dose of doxycycline may prevent the development of a more serious condition.  Based on your information I have Provided a home care guide for tick bites  and  instructions on when to call for help. and Your symptoms indicate that you need a longer course of antibiotics and a follow up visit with a provider. I have sent doxycycline 100 mg twice a day for 21 days to the pharmacy that you selected. You will need to schedule a follow up visit with your provider. If you do not have a primary care provider you may use our telehealth physicians on the web at [email or web address].  Which ticks  are associated with illness?  The Wood Tick (dog tick) is the size of a watermelon seed and can sometimes transmit Rocky Mountain spotted fever and [place] tick fever.   The Deer Tick (black-legged tick) is between the size of a poppy seed (pin head) and an apple seed, and can sometimes transmit Lyme disease.  A brown to black tick with a white splotch on its back is likely a female Amblyomma americanum ([place] tick). This tick has been  associated with Southern Tick Associated illness ( STARI)  Lyme disease has become the most common tick-borne illness in the [place]. The risk of Lyme disease following a recognized deer tick bite is estimated to be 1%.  The majority of cases of Lyme disease start with a bull's eye rash at the site of the tick bite. The rash can occur days to weeks (typically 7-10 days) after a tick bite. Treatment with antibiotics is indicated if this rash appears. Flu-like symptoms may accompany the rash, including: fever, chills, headaches, muscle aches, and fatigue. Removing ticks promptly may prevent tick borne disease.  What can be used to prevent Tick Bites?   Insect repellant with at leas 20% DEET.  Wearing long pants with sock and shoes.  Avoiding tall grass and heavily wooded areas.  Checking your skin after being outdoors.  Shower with a washcloth after outdoor exposures.  HOME CARE ADVICE FOR TICK BITE  1. Wood Tick Removal:  o Use a pair of tweezers and grasp the wood tick close to the skin (on its head). Pull the wood tick straight upward without twisting or crushing it. Maintain a steady pressure until it releases its grip.   o If tweezers aren't available, use fingers, a loop of thread around the jaws, or a needle between the jaws for traction.  o Note: covering the tick with petroleum jelly, nail polish or rubbing alcohol doesn't work.   Neither does touching the tick with a hot or cold object. 2. Tiny Deer Tick Removal:   o Needs to be scraped off with a knife blade or credit card edge. o Place tick in a sealed container (e.g. glass jar, zip lock plastic bag), in case your doctor wants to see it. 3. Tick's Head Removal:  o If the wood tick's head breaks off in the skin, it must be removed. Clean the skin. Then use a sterile needle to uncover the head and lift it out or scrape it off.  o If a very small piece of the head remains, the skin will eventually slough it  off. 4. Antibiotic Ointment:  o Wash the wound and your hands with soap and water after removal to prevent catching any tick disease.  Apply an over the counter antibiotic ointment (e.g. bacitracin) to the bite once. 5. Expected Course: Tick bites normally don't itch or hurt. That's why they often go unnoticed. 6. Call Your Doctor If:  o You can't remove the tick or the tick's head o Fever, a severe head ache, or rash occur in the next 2 weeks o Bite begins to look infected o Lyme's disease is common in your area o You have not had a tetanus in the last 10 years o Your current symptoms become worse    MAKE SURE YOU   Understand these instructions.  Will watch your condition.  Will get help right away if you are not doing well or get worse.   Thank you for choosing an e-visit.  Your e-visit answers were reviewed by a board certified advanced clinical practitioner to complete your personal care plan. Depending upon the condition, your plan could have included both over the counter or prescription medications. Please review your pharmacy choice. If there is a problem you may use MyChart messaging to have the prescription routed to another pharmacy. Your safety is important to us. If you have drug allergies check your prescription carefully.   You can use MyChart to ask questions about today's visit, request a non-urgent call back, or ask for a work or school excuse for 24 hours related to this e-Visit. If it has been greater than 24 hours you will need to follow up with your provider, or enter a new e-Visit to address those concerns.  You will get an email in the next two days asking about your experience. I hope  that your e-visit has been valuable and will speed your recovery  

## 2018-03-07 MED FILL — DOXYCYCLINE HYCLATE 100 MG: 100 | 21 days supply | Qty: 42 | Fill #0

## 2018-03-22 MED FILL — TOBRAMYCIN 0.3 % SOLN: 0.3 | 12 days supply | Qty: 5 | Fill #0

## 2018-04-03 MED FILL — PEG-3350 SOLUTION: 420 | 1 days supply | Qty: 4000 | Fill #0

## 2018-04-13 DIAGNOSIS — K5289 Other specified noninfective gastroenteritis and colitis: Secondary | ICD-10-CM | POA: Diagnosis not present

## 2018-04-13 DIAGNOSIS — K633 Ulcer of intestine: Secondary | ICD-10-CM | POA: Diagnosis not present

## 2018-04-13 DIAGNOSIS — K449 Diaphragmatic hernia without obstruction or gangrene: Secondary | ICD-10-CM | POA: Diagnosis not present

## 2018-04-13 DIAGNOSIS — K2950 Unspecified chronic gastritis without bleeding: Secondary | ICD-10-CM | POA: Diagnosis not present

## 2018-04-13 DIAGNOSIS — K2930 Chronic superficial gastritis without bleeding: Secondary | ICD-10-CM | POA: Diagnosis not present

## 2018-04-13 DIAGNOSIS — R1011 Right upper quadrant pain: Secondary | ICD-10-CM | POA: Diagnosis not present

## 2018-04-13 DIAGNOSIS — Z800 Family history of malignant neoplasm of digestive organs: Secondary | ICD-10-CM | POA: Diagnosis not present

## 2018-04-13 DIAGNOSIS — Z1211 Encounter for screening for malignant neoplasm of colon: Secondary | ICD-10-CM | POA: Diagnosis not present

## 2018-04-13 DIAGNOSIS — K6389 Other specified diseases of intestine: Secondary | ICD-10-CM | POA: Diagnosis not present

## 2018-04-13 DIAGNOSIS — K219 Gastro-esophageal reflux disease without esophagitis: Secondary | ICD-10-CM | POA: Diagnosis not present

## 2018-04-13 LAB — HM COLONOSCOPY

## 2018-04-17 HISTORY — PX: UPPER GASTROINTESTINAL ENDOSCOPY: SHX188

## 2018-04-25 ENCOUNTER — Encounter: Payer: Self-pay | Admitting: Family Medicine

## 2018-04-27 ENCOUNTER — Encounter: Payer: Self-pay | Admitting: *Deleted

## 2018-05-03 ENCOUNTER — Other Ambulatory Visit (HOSPITAL_COMMUNITY): Payer: Self-pay | Admitting: Gastroenterology

## 2018-05-03 DIAGNOSIS — R1011 Right upper quadrant pain: Secondary | ICD-10-CM

## 2018-05-16 ENCOUNTER — Ambulatory Visit (HOSPITAL_COMMUNITY)
Admission: RE | Admit: 2018-05-16 | Discharge: 2018-05-16 | Disposition: A | Discharge status: Home / Self Care | Payer: 59 | Source: Ambulatory Visit | Attending: Gastroenterology | Admitting: Gastroenterology

## 2018-05-16 DIAGNOSIS — R1011 Right upper quadrant pain: Secondary | ICD-10-CM | POA: Insufficient documentation

## 2018-05-16 DIAGNOSIS — N281 Cyst of kidney, acquired: Secondary | ICD-10-CM | POA: Diagnosis not present

## 2018-05-16 DIAGNOSIS — K7689 Other specified diseases of liver: Secondary | ICD-10-CM | POA: Diagnosis not present

## 2018-07-06 DIAGNOSIS — D2372 Other benign neoplasm of skin of left lower limb, including hip: Secondary | ICD-10-CM | POA: Diagnosis not present

## 2018-07-06 DIAGNOSIS — D485 Neoplasm of uncertain behavior of skin: Secondary | ICD-10-CM | POA: Diagnosis not present

## 2018-07-06 DIAGNOSIS — L814 Other melanin hyperpigmentation: Secondary | ICD-10-CM | POA: Diagnosis not present

## 2018-07-14 MED FILL — SULFAMETHOXAZOLE-TMP DS TAB: 800-160 | 10 days supply | Qty: 20 | Fill #0

## 2018-07-15 ENCOUNTER — Telehealth: Payer: 59 | Admitting: Internal Medicine

## 2018-07-15 DIAGNOSIS — L03012 Cellulitis of left finger: Secondary | ICD-10-CM | POA: Diagnosis not present

## 2018-07-15 MED ORDER — CEPHALEXIN 500 MG PO CAPS: 500.0000 mg | ORAL_CAPSULE | Freq: Three times a day (TID) | ORAL | 0 refills | Status: DC

## 2018-07-15 NOTE — Progress Notes (Signed)
   MAKE SURE YOU:We are so sorry you are not feeling well. This is not usually something we woud treat inan evisit br since it is the weekend I will treat you.  After reviewing you questionnaire, it sounds like you have paronychia. This is an bacterial infection around a nail bed. I have sent in keflex 500mg  1 po 3x a day for 10 days. - soak in epsom salt 2x a day - see PcP if not getting better by Monday.  Understand these instructions. Will watch your condition. Will get help right away if you are not doing well or get worse.  Thank you for choosing an e-visit. Your e-visit answers were reviewed by a board certified advanced clinical practitioner to complete your personal care plan. Depending upon the condition, your plan could have included both over the counter or prescription medications. Please review your pharmacy choice. Be sure that the pharmacy you have chosen is open so that you can pick up your prescription now.  If there is a problem you may message your provider in [place] to have the prescription routed to another pharmacy. Your safety is important to [place]. If you have drug allergies check your prescription carefully.  For the next 24 hours, you can use MyChart to ask questions about today's visit, request a non-urgent call back, or ask for a work or school excuse from your e-visit provider. You will get an email in the next two days asking about your experience. I hope that your e-visit has been valuable and will speed your recovery.

## 2018-07-27 DIAGNOSIS — L03012 Cellulitis of left finger: Secondary | ICD-10-CM | POA: Diagnosis not present

## 2018-07-27 DIAGNOSIS — M79645 Pain in left finger(s): Secondary | ICD-10-CM | POA: Diagnosis not present

## 2018-08-08 DIAGNOSIS — L03012 Cellulitis of left finger: Secondary | ICD-10-CM | POA: Diagnosis not present

## 2018-08-08 DIAGNOSIS — M79645 Pain in left finger(s): Secondary | ICD-10-CM | POA: Diagnosis not present

## 2018-08-08 MED FILL — DOXYCYCLINE HYCLATE 100 MG: 100 | 10 days supply | Qty: 20 | Fill #0

## 2018-08-15 DIAGNOSIS — M8580 Other specified disorders of bone density and structure, unspecified site: Secondary | ICD-10-CM | POA: Diagnosis not present

## 2018-08-15 DIAGNOSIS — Z13220 Encounter for screening for lipoid disorders: Secondary | ICD-10-CM | POA: Diagnosis not present

## 2018-08-15 DIAGNOSIS — Z1329 Encounter for screening for other suspected endocrine disorder: Secondary | ICD-10-CM | POA: Diagnosis not present

## 2018-08-15 DIAGNOSIS — Z01419 Encounter for gynecological examination (general) (routine) without abnormal findings: Secondary | ICD-10-CM | POA: Diagnosis not present

## 2018-08-15 DIAGNOSIS — Z113 Encounter for screening for infections with a predominantly sexual mode of transmission: Secondary | ICD-10-CM | POA: Diagnosis not present

## 2018-08-16 DIAGNOSIS — H43811 Vitreous degeneration, right eye: Secondary | ICD-10-CM | POA: Diagnosis not present

## 2018-08-16 DIAGNOSIS — H3554 Dystrophies primarily involving the retinal pigment epithelium: Secondary | ICD-10-CM | POA: Diagnosis not present

## 2018-08-16 DIAGNOSIS — D3132 Benign neoplasm of left choroid: Secondary | ICD-10-CM | POA: Diagnosis not present

## 2018-08-16 DIAGNOSIS — H35412 Lattice degeneration of retina, left eye: Secondary | ICD-10-CM | POA: Diagnosis not present

## 2018-09-18 MED FILL — FLUCONAZOLE 200 MG TAB: 200 | 14 days supply | Qty: 28 | Fill #0

## 2018-10-10 ENCOUNTER — Ambulatory Visit: Payer: 59 | Admitting: Internal Medicine

## 2018-10-10 ENCOUNTER — Encounter: Payer: Self-pay | Admitting: Internal Medicine

## 2018-10-10 DIAGNOSIS — L03012 Cellulitis of left finger: Secondary | ICD-10-CM

## 2018-10-10 HISTORY — DX: Cellulitis of left finger: L03.012

## 2018-10-10 NOTE — Progress Notes (Signed)
[facility]      Reason for Consult: chronic paronchia    Referring Physician: Dr. Caralyn Guile    Patient ID: Robin Henry, female    DOB: 1956-06-14, 63 y.o.   MRN: [id number]  HPI:   She is here for evaluation of above.  This has been ongoing for about 6 months.  Initially she was seen by her primary physician who treated her with oral antibacterial antibiotics.  She continued to have the same issue and was sent to a dermatologist who recommended soaking her finger in vinegar however she felt that this irritated the finger more.  She was able to express pus out of her finger and after further trials of antibiotics was sent to Dr. Apolonio Schneiders.  She did get a culture done there which grew out Candida and was placed on oral antifungal therapy, initially with itraconazole for 1 week and then fluconazole.  She also underwent debridement and nail removal by Dr. Apolonio Schneiders.  She was sent her for recommendations on treatment duration.  She has completed about 4 weeks of antifungal therapy and her nail is growing back.  Finished antifungals at the end of December.     Past Medical History:  Diagnosis Date  . Hoarseness 2018   with cough; laryngoscopy by Dr. Wilburn Cornelia showed all normal except postglottic erythema c/w reflux.  Pt started on PPI q 5 PM.  If cough persists, consider referral to [facility].  . Laryngopharyngeal reflux (LPR) 11/2016  . Macular dystrophy    Hx of detached retina (Dr. Baird Cancer)  . Osteoarthritis, multiple sites    Primarily knees    Prior to Admission medications   Medication Sig Start Date End Date Taking? Authorizing Provider  Calcium-Vitamin D 600-200 MG-UNIT tablet Take 2 tablets by mouth daily.    Yes [provider]  fish oil-omega-3 fatty acids 1000 MG capsule Take 2 g by mouth daily.     Yes [provider]  Multiple Vitamins-Minerals (VISION-VITE PRESERVE PO) Take by mouth daily.     Yes [provider]    VITAMIN D, CHOLECALCIFEROL, PO Take 500 mg by mouth 1 day or 1 dose.   Yes [provider]  Zoster Vaccine Adjuvanted [facility]) injection IM 50 MCG once, then repeat dose in 2-6 months once 02/20/18  Yes Kuneff, Renee A, DO  cephALEXin (KEFLEX) 500 MG capsule Take 1 capsule (500 mg total) by mouth 3 (three) times daily. Patient not taking: Reported on 10/10/2018 07/15/18   Chevis Pretty, FNP  doxycycline (VIBRA-TABS) 100 MG tablet Take 1 tablet (100 mg total) by mouth 2 (two) times daily. Patient not taking: Reported on 10/10/2018 03/06/18   Benjamine Mola, FNP  fluconazole (DIFLUCAN) 200 MG tablet Diflucan 200 mg tablet  Take 1 tablet twice a day by oral route as directed for 14 days.    [provider]    No Known Allergies  Social History   Tobacco Use  . Smoking status: Never Smoker  . Smokeless tobacco: Never Used  Substance Use Topics  . Alcohol use: Yes    Comment: rare  . Drug use: No    Family History  Problem Relation Age of Onset  . Hypertension Mother   . Emphysema Mother   . Cancer Mother        cartoid  . Colon cancer Paternal Grandfather   . Alcohol abuse Sister   . Alcohol abuse Maternal Grandmother   . Lung cancer  Maternal Grandmother     Review of Systems  Constitutional: negative for fevers and chills Gastrointestinal: negative for diarrhea Musculoskeletal: negative for arthralgias All other systems reviewed and are negative    Constitutional: in no apparent distress  Vitals:   10/10/18 0859  BP: 116/81  Pulse: 88   EYES: anicteric Musculoskeletal: left thumb with growing nail, some small area of blanching erythema, no warmth, minimal tenderness at the nail bed; no drainage Skin: no rash Neuro: non focal   Labs: Lab Results  Component Value Date   WBC 5.5 02/20/2018   HGB 13.8 02/20/2018   HCT 40.9 02/20/2018   MCV 87.2 02/20/2018   PLT 277.0 02/20/2018    Lab Results  Component Value Date   CREATININE 0.78  02/20/2018   BUN 15 02/20/2018   NA 141 02/20/2018   K 4.3 02/20/2018   CL 104 02/20/2018   CO2 30 02/20/2018    Lab Results  Component Value Date   ALT 13 02/20/2018   AST 13 02/20/2018   ALKPHOS 67 02/20/2018   BILITOT 0.9 02/20/2018     Assessment: chronic paronychia, now resolved.   Plan: 1) observation off of antibiotics, call if worsening arises.  May consider topical steroids at that time.

## 2018-10-27 MED FILL — LATISSE 0.03% EYELASH SOLN: 0.03 | 30 days supply | Qty: 5 | Fill #0

## 2018-11-30 ENCOUNTER — Other Ambulatory Visit: Payer: Self-pay | Admitting: Physician Assistant

## 2018-11-30 DIAGNOSIS — Z13820 Encounter for screening for osteoporosis: Secondary | ICD-10-CM

## 2018-12-24 IMAGING — US US ABDOMEN COMPLETE
1 series · 13 of 25 positions shown · non-contrast
Comparison: None.

CLINICAL DATA: Upper abdominal pain

EXAM:
ABDOMEN ULTRASOUND COMPLETE

[Series 1: us abdomen complete · 13 of 122 slices shown]
[im 1/122]
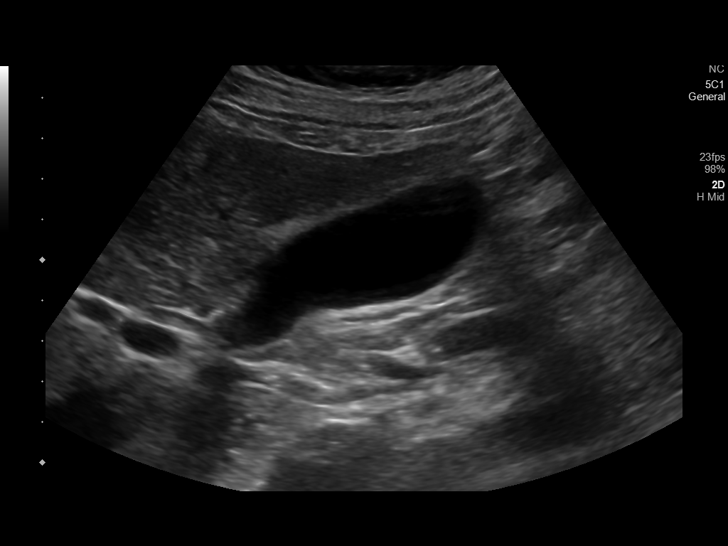
[im 11/122]
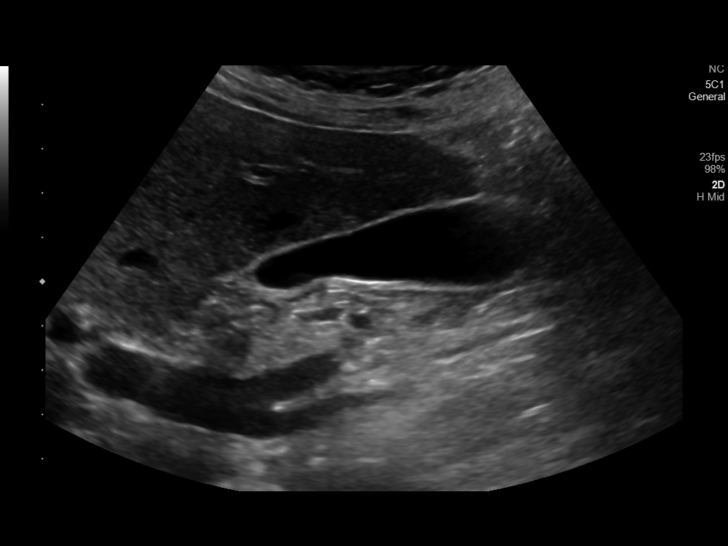
[im 21/122]
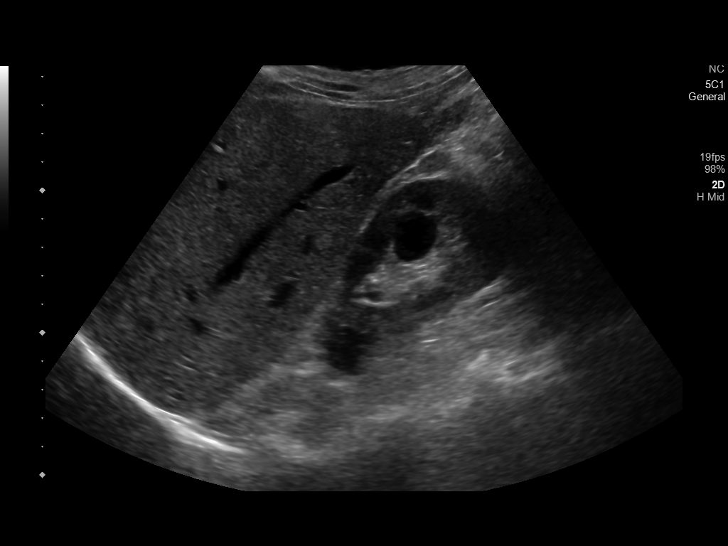
[im 31/122]
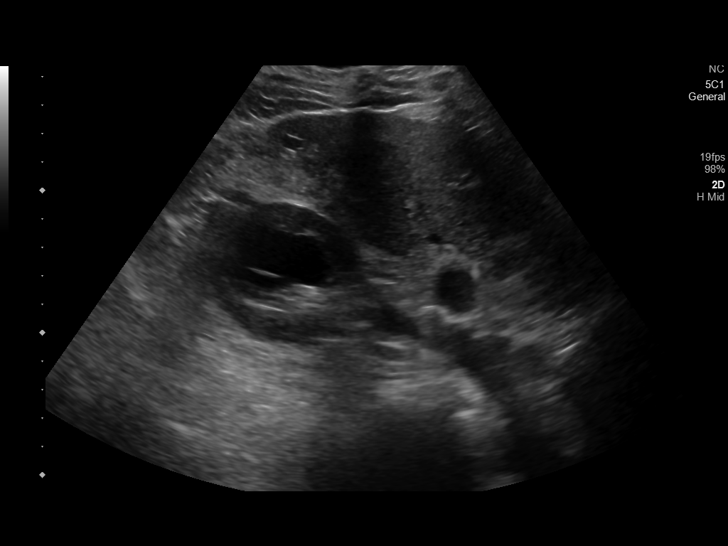
[im 41/122]
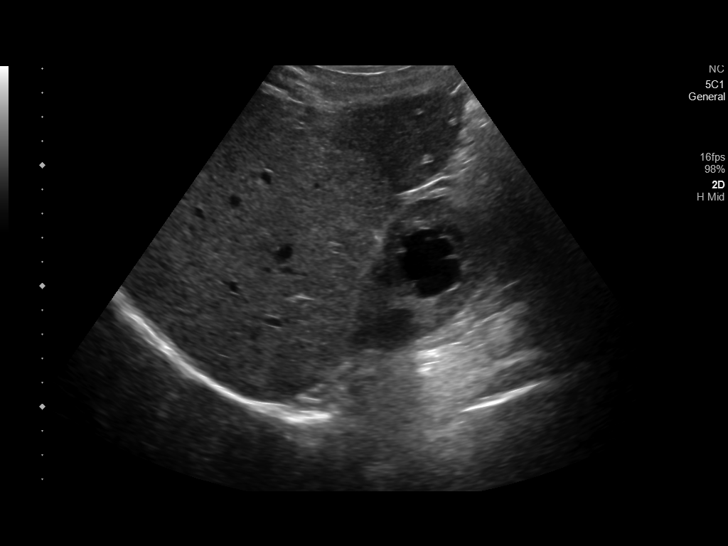
[im 51/122]
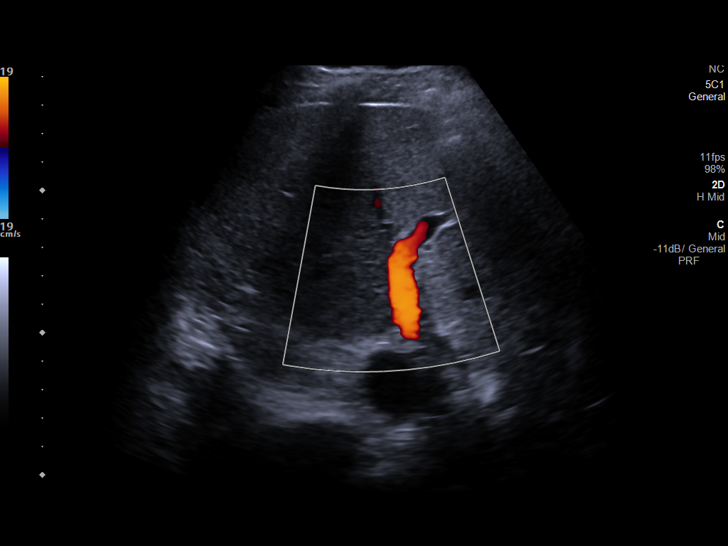
[im 61/122]
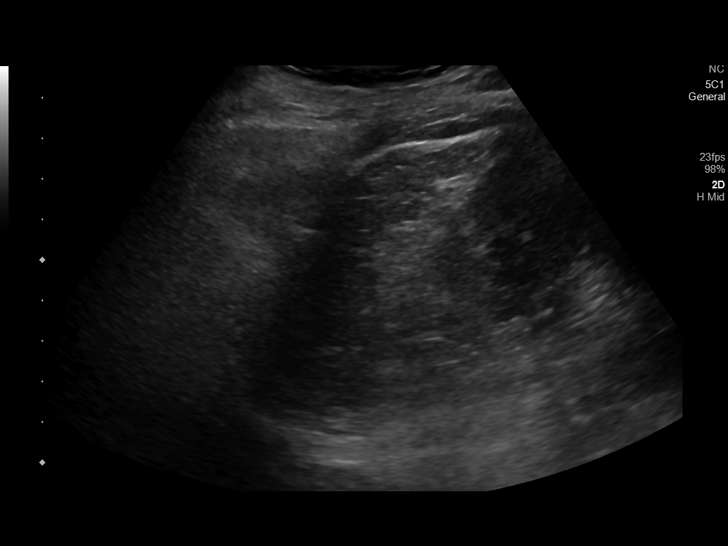
[im 71/122]
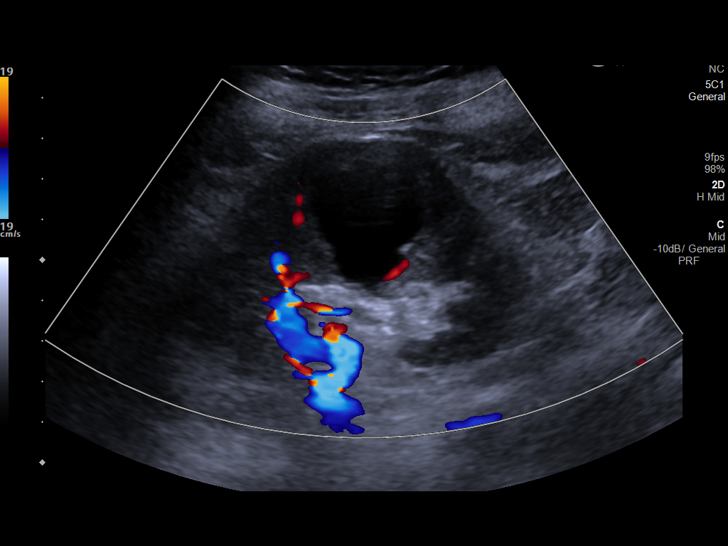
[im 81/122]
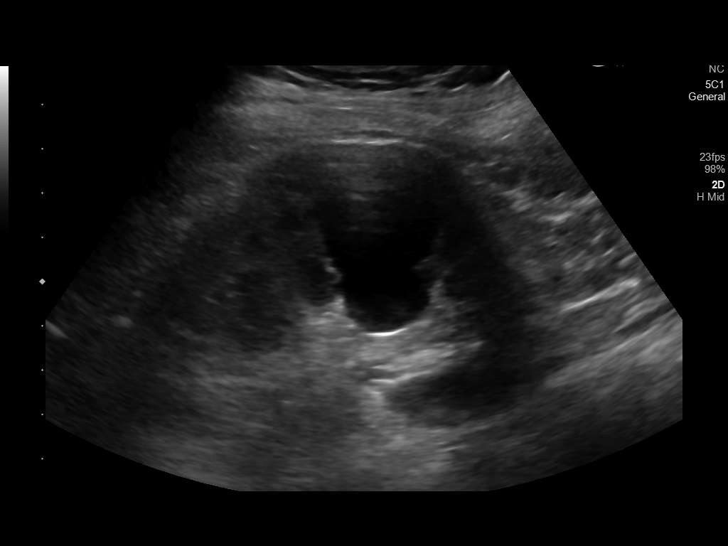
[im 91/122]
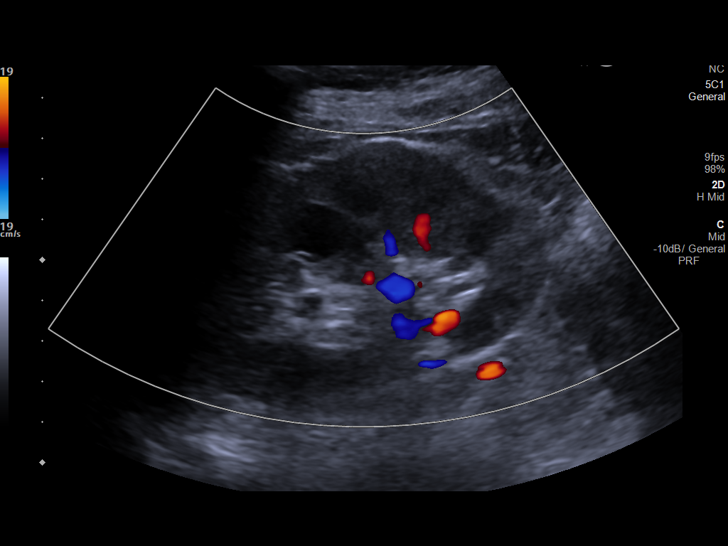
[im 101/122]
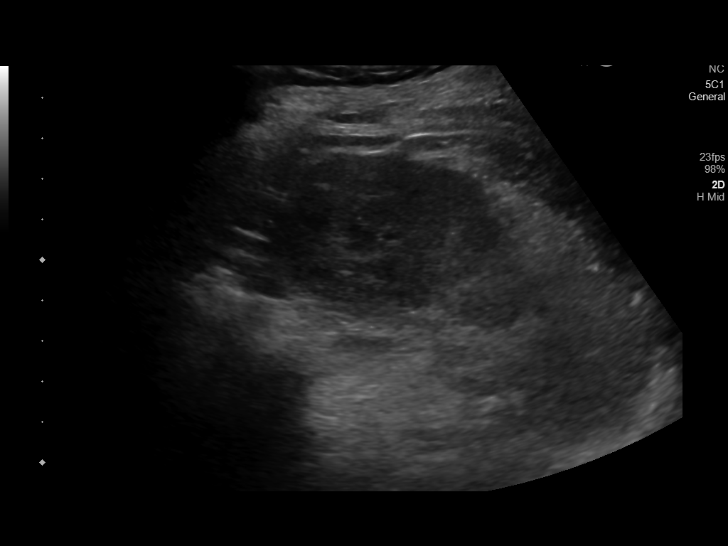
[im 111/122]
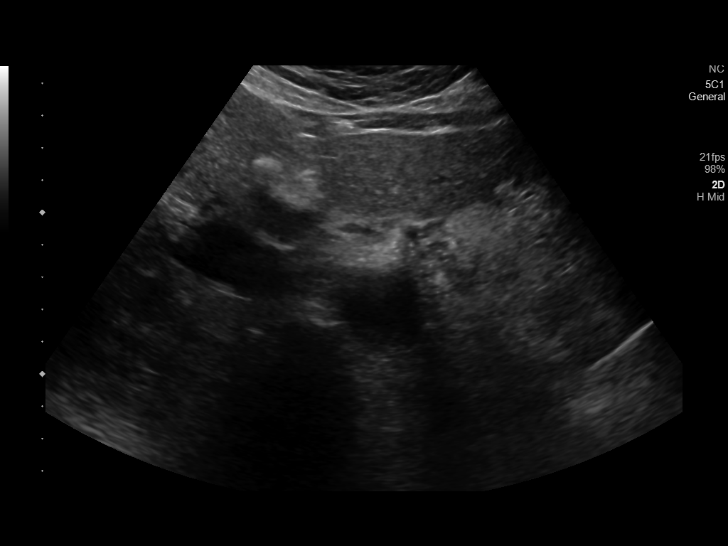
[im 122/122]
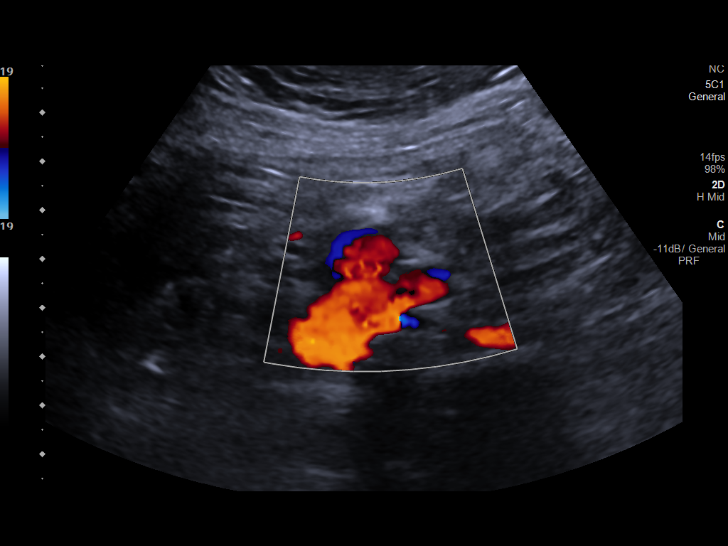

[13 of 25 positions shown; findings below may reference images not displayed]

FINDINGS: Gallbladder: No gallstones or wall thickening visualized. There is
no pericholecystic fluid. No sonographic Murphy sign noted by
sonographer.

Common bile duct: Diameter: 2 mm. No intrahepatic, common hepatic,
or common bile duct dilatation.

Liver: No focal lesion identified. Within normal limits in
parenchymal echogenicity. Portal vein is patent on color Doppler
imaging with normal direction of blood flow towards the liver.

IVC: No abnormality visualized.

Pancreas: There is no pancreatic mass or inflammatory focus.

Spleen: Size and appearance within normal limits.

Right Kidney: Length: 9.6 cm. Echogenicity within normal limits. No
hydronephrosis visualized. There is a cyst in the mid right kidney
containing a single thin septation measuring 2.8 x 2.9 x 3.2 cm.
There is a cyst more medially on the right measuring 2.2 x 2.6 x
cm.

Left Kidney: Length: 10.3 cm. Echogenicity within normal limits. No
hydronephrosis visualized. There is a cyst in the mid left kidney
measuring 1.5 x 1.4 x 2.2 cm. A cyst slightly more superiorly
measures 1.1 x 1.2 x 1.3 cm.

Abdominal aorta: No aneurysm visualized.

Other findings: No demonstrable ascites.
IMPRESSION: Renal cysts bilaterally. Cyst in left lobe of liver. Study otherwise
unremarkable.

## 2019-01-18 ENCOUNTER — Other Ambulatory Visit: Payer: Self-pay | Admitting: Family Medicine

## 2019-01-18 ENCOUNTER — Other Ambulatory Visit: Payer: Self-pay | Admitting: Physician Assistant

## 2019-01-18 DIAGNOSIS — Z1231 Encounter for screening mammogram for malignant neoplasm of breast: Secondary | ICD-10-CM

## 2019-01-29 ENCOUNTER — Other Ambulatory Visit: Payer: 59

## 2019-02-06 ENCOUNTER — Ambulatory Visit (INDEPENDENT_AMBULATORY_CARE_PROVIDER_SITE_OTHER): Payer: 59 | Admitting: Family Medicine

## 2019-02-06 ENCOUNTER — Encounter: Payer: Self-pay | Admitting: Family Medicine

## 2019-02-06 ENCOUNTER — Telehealth: Payer: Self-pay | Admitting: Family Medicine

## 2019-02-06 ENCOUNTER — Other Ambulatory Visit: Payer: Self-pay

## 2019-02-06 VITALS — BP 114/81 | HR 61 | Temp 98.2°F | Resp 16 | Ht 68.0 in | Wt 135.5 lb

## 2019-02-06 DIAGNOSIS — Z130 Encounter for screening for diseases of the blood and blood-forming organs and certain disorders involving the immune mechanism: Secondary | ICD-10-CM

## 2019-02-06 DIAGNOSIS — K219 Gastro-esophageal reflux disease without esophagitis: Secondary | ICD-10-CM | POA: Insufficient documentation

## 2019-02-06 DIAGNOSIS — K5909 Other constipation: Secondary | ICD-10-CM | POA: Insufficient documentation

## 2019-02-06 DIAGNOSIS — Z13220 Encounter for screening for lipoid disorders: Secondary | ICD-10-CM

## 2019-02-06 DIAGNOSIS — B354 Tinea corporis: Secondary | ICD-10-CM

## 2019-02-06 DIAGNOSIS — Z131 Encounter for screening for diabetes mellitus: Secondary | ICD-10-CM | POA: Diagnosis not present

## 2019-02-06 DIAGNOSIS — Z0000 Encounter for general adult medical examination without abnormal findings: Secondary | ICD-10-CM | POA: Diagnosis not present

## 2019-02-06 DIAGNOSIS — E559 Vitamin D deficiency, unspecified: Secondary | ICD-10-CM | POA: Diagnosis not present

## 2019-02-06 DIAGNOSIS — R252 Cramp and spasm: Secondary | ICD-10-CM

## 2019-02-06 HISTORY — DX: Tinea corporis: B35.4

## 2019-02-06 HISTORY — DX: Gastro-esophageal reflux disease without esophagitis: K21.9

## 2019-02-06 HISTORY — DX: Cramp and spasm: R25.2

## 2019-02-06 MED ORDER — CICLOPIROX OLAMINE 0.77 % EX CREA: TOPICAL_CREAM | Freq: Two times a day (BID) | CUTANEOUS | 0 refills | Status: DC

## 2019-02-06 MED ORDER — OMEPRAZOLE 40 MG PO CPDR: 40.0000 mg | DELAYED_RELEASE_CAPSULE | Freq: Every day | ORAL | 2 refills | Status: DC

## 2019-02-06 MED FILL — OMEPRAZOLE 40 MG CPDR: 40 | 30 days supply | Qty: 30 | Fill #0

## 2019-02-06 MED FILL — CICLOPIROX 0.77% CREAM: 0.77 | 15 days supply | Qty: 15 | Fill #0

## 2019-02-06 NOTE — Patient Instructions (Addendum)
We will call you with lab location and then results once we get you set up for them.  ZICO coconut   Try a low dose magnesium 250 mg or miralax.  Start b12 1000 mg a day   Health Maintenance, Female Adopting a healthy lifestyle and getting preventive care can go a long way to promote health and wellness. Talk with your health care provider about what schedule of regular examinations is right for you. This is a good chance for you to check in with your provider about disease prevention and staying healthy. In between checkups, there are plenty of things you can do on your own. Experts have done a lot of research about which lifestyle changes and preventive measures are most likely to keep you healthy. Ask your health care provider for more information. Weight and diet Eat a healthy diet  Be sure to include plenty of vegetables, fruits, low-fat dairy products, and lean protein.  Do not eat a lot of foods high in solid fats, added sugars, or salt.  Get regular exercise. This is one of the most important things you can do for your health. ? Most adults should exercise for at least 150 minutes each week. The exercise should increase your heart rate and make you sweat (moderate-intensity exercise). ? Most adults should also do strengthening exercises at least twice a week. This is in addition to the moderate-intensity exercise. Maintain a healthy weight  Body mass index (BMI) is a measurement that can be used to identify possible weight problems. It estimates body fat based on height and weight. Your health care provider can help determine your BMI and help you achieve or maintain a healthy weight.  For females 28 years of age and older: ? A BMI below 18.5 is considered underweight. ? A BMI of 18.5 to 24.9 is normal. ? A BMI of 25 to 29.9 is considered overweight. ? A BMI of 30 and above is considered obese. Watch levels of cholesterol and blood lipids  You should start having your blood  tested for lipids and cholesterol at 64 years of age, then have this test every 5 years.  You may need to have your cholesterol levels checked more often if: ? Your lipid or cholesterol levels are high. ? You are older than 64 years of age. ? You are at high risk for heart disease. Cancer screening Lung Cancer  Lung cancer screening is recommended for adults 8-67 years old who are at high risk for lung cancer because of a history of smoking.  A yearly low-dose CT scan of the lungs is recommended for people who: ? Currently smoke. ? Have quit within the past 15 years. ? Have at least a 30-pack-year history of smoking. A pack year is smoking an average of one pack of cigarettes a day for 1 year.  Yearly screening should continue until it has been 15 years since you quit.  Yearly screening should stop if you develop a health problem that would prevent you from having lung cancer treatment. Breast Cancer  Practice breast self-awareness. This means understanding how your breasts normally appear and feel.  It also means doing regular breast self-exams. Let your health care provider know about any changes, no matter how small.  If you are in your 20s or 30s, you should have a clinical breast exam (CBE) by a health care provider every 1-3 years as part of a regular health exam.  If you are 18 or older, have a CBE  every year. Also consider having a breast X-ray (mammogram) every year.  If you have a family history of breast cancer, talk to your health care provider about genetic screening.  If you are at high risk for breast cancer, talk to your health care provider about having an MRI and a mammogram every year.  Breast cancer gene (BRCA) assessment is recommended for women who have family members with BRCA-related cancers. BRCA-related cancers include: ? Breast. ? Ovarian. ? Tubal. ? Peritoneal cancers.  Results of the assessment will determine the need for genetic counseling and  BRCA1 and BRCA2 testing. Cervical Cancer Your health care provider may recommend that you be screened regularly for cancer of the pelvic organs (ovaries, uterus, and vagina). This screening involves a pelvic examination, including checking for microscopic changes to the surface of your cervix (Pap test). You may be encouraged to have this screening done every 3 years, beginning at age 8.  For women ages 15-65, health care providers may recommend pelvic exams and Pap testing every 3 years, or they may recommend the Pap and pelvic exam, combined with testing for human papilloma virus (HPV), every 5 years. Some types of HPV increase your risk of cervical cancer. Testing for HPV may also be done on women of any age with unclear Pap test results.  Other health care providers may not recommend any screening for nonpregnant women who are considered low risk for pelvic cancer and who do not have symptoms. Ask your health care provider if a screening pelvic exam is right for you.  If you have had past treatment for cervical cancer or a condition that could lead to cancer, you need Pap tests and screening for cancer for at least 20 years after your treatment. If Pap tests have been discontinued, your risk factors (such as having a new sexual partner) need to be reassessed to determine if screening should resume. Some women have medical problems that increase the chance of getting cervical cancer. In these cases, your health care provider may recommend more frequent screening and Pap tests. Colorectal Cancer  This type of cancer can be detected and often prevented.  Routine colorectal cancer screening usually begins at 64 years of age and continues through 64 years of age.  Your health care provider may recommend screening at an earlier age if you have risk factors for colon cancer.  Your health care provider may also recommend using home test kits to check for hidden blood in the stool.  A small camera at  the end of a tube can be used to examine your colon directly (sigmoidoscopy or colonoscopy). This is done to check for the earliest forms of colorectal cancer.  Routine screening usually begins at age 65.  Direct examination of the colon should be repeated every 5-10 years through 64 years of age. However, you may need to be screened more often if early forms of precancerous polyps or small growths are found. Skin Cancer  Check your skin from head to toe regularly.  Tell your health care provider about any new moles or changes in moles, especially if there is a change in a mole's shape or color.  Also tell your health care provider if you have a mole that is larger than the size of a pencil eraser.  Always use sunscreen. Apply sunscreen liberally and repeatedly throughout the day.  Protect yourself by wearing long sleeves, pants, a wide-brimmed hat, and sunglasses whenever you are outside. Heart disease, diabetes, and high blood pressure  High blood pressure causes heart disease and increases the risk of stroke. High blood pressure is more likely to develop in: ? People who have blood pressure in the high end of the normal range (130-139/85-89 mm Hg). ? People who are overweight or obese. ? People who are African American.  If you are 40-90 years of age, have your blood pressure checked every 3-5 years. If you are 109 years of age or older, have your blood pressure checked every year. You should have your blood pressure measured twice-once when you are at a hospital or clinic, and once when you are not at a hospital or clinic. Record the average of the two measurements. To check your blood pressure when you are not at a hospital or clinic, you can use: ? An automated blood pressure machine at a pharmacy. ? A home blood pressure monitor.  If you are between 108 years and 44 years old, ask your health care provider if you should take aspirin to prevent strokes.  Have regular diabetes  screenings. This involves taking a blood sample to check your fasting blood sugar level. ? If you are at a normal weight and have a low risk for diabetes, have this test once every three years after 64 years of age. ? If you are overweight and have a high risk for diabetes, consider being tested at a younger age or more often. Preventing infection Hepatitis B  If you have a higher risk for hepatitis B, you should be screened for this virus. You are considered at high risk for hepatitis B if: ? You were born in a country where hepatitis B is common. Ask your health care provider which countries are considered high risk. ? Your parents were born in a high-risk country, and you have not been immunized against hepatitis B (hepatitis B vaccine). ? You have HIV or AIDS. ? You use needles to inject street drugs. ? You live with someone who has hepatitis B. ? You have had sex with someone who has hepatitis B. ? You get hemodialysis treatment. ? You take certain medicines for conditions, including cancer, organ transplantation, and autoimmune conditions. Hepatitis C  Blood testing is recommended for: ? Everyone born from [birth year] through 1966. ? Anyone with known risk factors for hepatitis C. Sexually transmitted infections (STIs)  You should be screened for sexually transmitted infections (STIs) including gonorrhea and chlamydia if: ? You are sexually active and are younger than 64 years of age. ? You are older than 64 years of age and your health care provider tells you that you are at risk for this type of infection. ? Your sexual activity has changed since you were last screened and you are at an increased risk for chlamydia or gonorrhea. Ask your health care provider if you are at risk.  If you do not have HIV, but are at risk, it may be recommended that you take a prescription medicine daily to prevent HIV infection. This is called pre-exposure prophylaxis (PrEP). You are considered at risk  if: ? You are sexually active and do not regularly use condoms or know the HIV status of your partner(s). ? You take drugs by injection. ? You are sexually active with a partner who has HIV. Talk with your health care provider about whether you are at high risk of being infected with HIV. If you choose to begin PrEP, you should first be tested for HIV. You should then be tested every 3 months for as long  as you are taking PrEP. Pregnancy  If you are premenopausal and you may become pregnant, ask your health care provider about preconception counseling.  If you may become pregnant, take 400 to 800 micrograms (mcg) of folic acid every day.  If you want to prevent pregnancy, talk to your health care provider about birth control (contraception). Osteoporosis and menopause  Osteoporosis is a disease in which the bones lose minerals and strength with aging. This can result in serious bone fractures. Your risk for osteoporosis can be identified using a bone density scan.  If you are 75 years of age or older, or if you are at risk for osteoporosis and fractures, ask your health care provider if you should be screened.  Ask your health care provider whether you should take a calcium or vitamin D supplement to lower your risk for osteoporosis.  Menopause may have certain physical symptoms and risks.  Hormone replacement therapy may reduce some of these symptoms and risks. Talk to your health care provider about whether hormone replacement therapy is right for you. Follow these instructions at home:  Schedule regular health, dental, and eye exams.  Stay current with your immunizations.  Do not use any tobacco products including cigarettes, chewing tobacco, or electronic cigarettes.  If you are pregnant, do not drink alcohol.  If you are breastfeeding, limit how much and how often you drink alcohol.  Limit alcohol intake to no more than 1 drink per day for nonpregnant women. One drink equals  12 ounces of beer, 5 ounces of wine, or 1 ounces of hard liquor.  Do not use street drugs.  Do not share needles.  Ask your health care provider for help if you need support or information about quitting drugs.  Tell your health care provider if you often feel depressed.  Tell your health care provider if you have ever been abused or do not feel safe at home. This information is not intended to replace advice given to you by your health care provider. Make sure you discuss any questions you have with your health care provider. Document Released: 04/05/2011 Document Revised: 02/26/2016 Document Reviewed: 06/24/2015 Elsevier Interactive Patient Education  2019 [employer].

## 2019-02-06 NOTE — Progress Notes (Signed)
Patient ID: Robin Henry, female  DOB: 29-May-1956, 63 y.o.   MRN: [id number] Patient Care Team    Relationship Specialty Notifications Start End  Ma Hillock, DO PCP - General Family Medicine  10/29/16   Jerrell Belfast, MD Consulting Physician Otolaryngology  11/15/16   Clarene Essex, MD Consulting Physician Gastroenterology  04/25/18     Chief Complaint  Patient presents with  . Annual Exam    Pt had 1st shingles vaccine but did not get the second dose, would like to discuss before getting. Pap smear x1 yr ago Dr Alvino Chapel @ [place] [phone], Mammogram and bone density is scheduled for next week   . Heartburn    Pt has been having heartburn, has hernia. Waking up with chest pain at night. Pt has constant sore throat     Subjective:  Robin Henry is a 63 y.o.  Female  present for CPE. All past medical history, surgical history, allergies, family history, immunizations, medications and social history were updated in the electronic medical record today. All recent labs, ED visits and hospitalizations within the last year were reviewed.  Gerd: Patient reports waking up with a sore throat, chronic cough for months and some chest discomfort like heartburn.  She reportedly has a hiatal hernia.  Her gastroenterologist is Dr. Watt Climes.  She has not tried over-the-counter medications.  Ring worm: Patient reports a small skin lesion on her right buttocks that is red round and flaky.  She has been using a over-the-counter antifungal cream over the last few days, and it may be having mild improvement with fungal cream use.  Constipation: Patient reports she has chronic constipation in struggles with routine bowel habits.  She does not take anything over-the-counter to help her with stool softening.  She admits she does not drink enough water because she works in the [employer] and is unable to have frequent bathroom breaks.  foot cramps:  Patient reports she generally experiences cramps in  her bilateral lower extremities and feet.  She states that she even points her toes she gets cramps.  She admits she does not drink much water daily.  Health maintenance:  Colonoscopy: completed 04/13/2018, by [facility], follow up 10 year. Fhx colon cancer.  Mammogram: completed: 11/08/2017, birads 1. Breast center scheduled 04/2019 Cervical cancer screening: Hysterectomy Immunizations: tdap UTD 2016, Influenza UTD (encouraged yearly),zostavax 11/14/2015. Shingrix x1?  Infectious disease screening: Hep C and HIV completed DEXA: last completed 06/08/2011- scheduled 04/2019 Assistive device: none Oxygen use: none Patient has a Dental home. Hospitalizations/ED visits: reviewed   Depression screen [facility] 2/9 02/06/2019 02/20/2018  Decreased Interest 0 0  Down, Depressed, Hopeless 0 0  PHQ - 2 Score 0 0   No flowsheet data found.  Immunization History  Administered Date(s) Administered  . Influenza-Unspecified 07/04/2017  . Tdap 04/25/2015  . Zoster 11/14/2015  . Zoster Recombinat (Shingrix) 02/21/2018    Past Medical History:  Diagnosis Date  . Chronic paronychia of finger of left hand 10/10/2018  . Hoarseness 2018   with cough; laryngoscopy by Dr. Wilburn Cornelia showed all normal except postglottic erythema c/w reflux.  Pt started on PPI q 5 PM.  If cough persists, consider referral to [facility] cough clinic.  . Laryngopharyngeal reflux (LPR) 11/2016  . Macular dystrophy    Hx of detached retina (Dr. Baird Cancer)  . Osteoarthritis, multiple sites    Primarily knees   No Known Allergies Past Surgical History:  Procedure Laterality Date  . ABDOMINAL HYSTERECTOMY  2001   fibroids  . COLONOSCOPY  09/08/2012   Normal.  Recall 10 yrs.  Procedure: COLONOSCOPY;  Surgeon: Jeryl Columbia, MD;  Location: [facility] ENDOSCOPY;  Service: Endoscopy;  Laterality: N/A;  wants no sedation-but start iv  . UPPER GASTROINTESTINAL ENDOSCOPY  04/17/2018   neg H.Pylori, terminal ileum eroded chronic active ileitis negative for  dysplasia, CMV neg.    Family History  Problem Relation Age of Onset  . Hypertension Mother   . Emphysema Mother   . Cancer Mother        cartoid  . Colon cancer Paternal Grandfather   . Alcohol abuse Sister   . Alcohol abuse Maternal Grandmother   . Lung cancer Maternal Grandmother    Social History   Social History Narrative   Widow, no children.   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  [place] resident since 1989.   Occup: CRNA   No T/A/Ds.   Exercises >3 X/week.    Allergies as of 02/06/2019   No Known Allergies     Medication List       Accurate as of Feb 06, 2019  5:59 PM. Always use your most recent med list.        ciclopirox 0.77 % cream Commonly known as:  LOPROX Apply topically 2 (two) times daily.   fish oil-omega-3 fatty acids 1000 MG capsule Take 2 g by mouth daily.   omeprazole 40 MG capsule Commonly known as:  PRILOSEC Take 1 capsule (40 mg total) by mouth daily.   VISION-VITE PRESERVE PO Take by mouth daily.   VITAMIN D (CHOLECALCIFEROL) PO Take 5,000 mg by mouth 1 day or 1 dose.   Zinc 100 MG Tabs Take 1 tablet by mouth daily.       All past medical history, surgical history, allergies, family history, immunizations andmedications were updated in the EMR today and reviewed under the history and medication portions of their EMR.     No results found for this or any previous visit (from the past 2160 hour(s)).  ROS: 14 pt review of systems performed and negative (unless mentioned in an HPI)  Objective: BP 114/81 (BP Location: Left Arm, Patient Position: Sitting, Cuff Size: Normal)   Pulse 61   Temp 98.2 F (36.8 C) (Oral)   Resp 16   Ht 5\' 8"  (1.727 m)   Wt 135 lb 8 oz (61.5 kg)   SpO2 96%   BMI 20.60 kg/m  Gen: Afebrile. No acute distress. Nontoxic in appearance, well-developed, well-nourished, pleasant, Caucasian female. HENT: AT. [place]. Bilateral TM visualized and normal in appearance, normal external auditory canal. MMM, no oral  lesions, adequate dentition. Bilateral nares within normal limits. Throat with mild left greater than right sided erythema, no ulcerations or exudates.  No cough on exam, no hoarseness on exam. Eyes:Pupils Equal Round Reactive to light, Extraocular movements intact,  Conjunctiva without redness, discharge or icterus. Neck/lymp/endocrine: Supple, no lymphadenopathy, no thyromegaly CV: RRR no murmur, no edema, +2/4 P posterior tibialis pulses.  No carotid bruits. No JVD. Chest: CTAB, no wheeze, rhonchi or crackles.  Normal respiratory effort.  Good air movement. Abd: Soft.  Flat. NTND. BS present.  No masses palpated. No hepatosplenomegaly. No rebound tenderness or guarding. Skin: no rashes, purpura or petechiae. Warm and well-perfused. Skin intact. Neuro/Msk: Normal gait. PERLA. EOMi. Alert. Oriented x3.  Cranial nerves II through XII intact. Muscle strength 5/5 upper/lower extremity. DTRs equal bilaterally. Psych: Normal affect, dress and demeanor. Normal speech. Normal thought content and judgment.  No exam data present  Assessment/plan: Robin Henry is a 63 y.o. female present for CPE. Encounter for preventive health examination Patient was encouraged to exercise greater than 150 minutes a week. Patient was encouraged to choose a diet filled with fresh fruits and vegetables, and lean meats. AVS provided to patient today for education/recommendation on gender specific health and safety maintenance. Colonoscopy: completed 04/13/2018, by [facility], follow up 10 year. Fhx colon cancer.  Mammogram: completed: 11/08/2017, birads 1. Breast center scheduled 04/2019 Cervical cancer screening: Hysterectomy Immunizations: tdap UTD 2016, Influenza UTD (encouraged yearly),zostavax 11/14/2015. Shingrix x1?  Infectious disease screening: Hep C and HIV completed DEXA: last completed 06/08/2011- scheduled 04/2019 Vitamin D deficiency - Vitamin D (25 hydroxy); Future -Reports taking a supplement but forgets to take  daily Lipid screening Continue omega 3 fatty acids - Lipid panel; Future Diabetes mellitus screening - HgB A1c; Future Screening for deficiency anemia - CBC; Future Gastroesophageal reflux disease, esophagitis presence not specified Signs and symptoms could be related to allergies or gastric reflux.  With her history of heartburn it sounds more reflux related.  Trial of Prilosec 40 mg daily.  If improving within 2 to 4 weeks complete a 29-month treatment and then will attempt to taper back off. -Symptoms are not improving with omeprazole consider GI referral. - Basic Metabolic Panel (BMET); Future - omeprazole (PRILOSEC) 40 MG capsule; Take 1 capsule (40 mg total) by mouth daily.  Dispense: 30 capsule; Refill: 2 -Follow-up 3 months, sooner if needed Tinea corporis She can continue the over-the-counter cream she is currently using.  In the event it does not clear her infection I have called in Loprox 0.77% cream. Follow-up in 4 weeks if not improved, sooner  if worsening Chronic constipation -Discussed some options with her today.  She can either try MiraLAX 1/4-1 cap daily in 8 ounces of water or try adding a low-dose magnesium supplement daily. -She was encouraged to increase her water consumption. 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  Try coconut water or propel to help with hydration and electolytes  Return in about 1 year (around 02/06/2020) for CPE. Patient's preventative physical exam was completed today in addition to > 15 minutes spent with patient, >50% of time spent face to face counseling on 3 new acute concerns.  Electronically signed by: Howard Pouch, DO [facility]

## 2019-02-06 NOTE — Telephone Encounter (Signed)
Please call pt and let her know she can get her labs at the [facility] tomorrow. Orders have been placed (please fax to them).  - I have called in the cream we spoke of for her possible tinea infection - I have also called in omeprazole to start for 4 weeks before bed. If her sore throat/heartburn symptoms resolve with use--> recommend 3 month course. By exam it is likely reflux causing her symptoms.  (prescribed). Also try to avoid meals at least 3 hours before bed/laying flat if possible. If symptoms worsen despite treatment - I would recommend she follow with her GI for further evaluation at that point.

## 2019-02-06 NOTE — Telephone Encounter (Signed)
Labs faxed to [facility] lab for patient to have labs drawn there per pt request. My chart message sent with instructions. Called pt and left message to check my chart or return call if needed

## 2019-02-07 ENCOUNTER — Other Ambulatory Visit (HOSPITAL_COMMUNITY)
Admission: RE | Admit: 2019-02-07 | Discharge: 2019-02-07 | Disposition: A | Discharge status: Home / Self Care | Payer: 59 | Source: Other Acute Inpatient Hospital | Attending: Family Medicine | Admitting: Family Medicine

## 2019-02-07 DIAGNOSIS — Z299 Encounter for prophylactic measures, unspecified: Secondary | ICD-10-CM | POA: Diagnosis not present

## 2019-02-07 LAB — BASIC METABOLIC PANEL
Anion gap: 8 (ref 5–15)
BUN: 15 mg/dL (ref 8–23)
CO2: 27 mmol/L (ref 22–32)
Calcium: 9.5 mg/dL (ref 8.9–10.3)
Chloride: 107 mmol/L (ref 98–111)
Creatinine, Ser: 0.8 mg/dL (ref 0.44–1.00)
GFR calc Af Amer: >60 mL/min (ref >60)
GFR calc non Af Amer: >60 mL/min (ref >60)
Glucose, Bld: 96 mg/dL (ref 70–99)
Potassium: 4.1 mmol/L (ref 3.5–5.1)
Sodium: 142 mmol/L (ref 135–145)

## 2019-02-07 LAB — CBC WITH DIFFERENTIAL/PLATELET
Abs Immature Granulocytes: 0.01 10*3/uL (ref 0.00–0.07)
Basophils Absolute: 0.1 10*3/uL (ref 0.0–0.1)
Basophils Relative: 1 %
Eosinophils Absolute: 0.2 10*3/uL (ref 0.0–0.5)
Eosinophils Relative: 3 %
HCT: 44.7 % (ref 36.0–46.0)
Hemoglobin: 14.4 g/dL (ref 12.0–15.0)
Immature Granulocytes: 0 %
Lymphocytes Relative: 36 %
Lymphs Abs: 2.2 10*3/uL (ref 0.7–4.0)
MCH: 29.3 pg (ref 26.0–34.0)
MCHC: 32.2 g/dL (ref 30.0–36.0)
MCV: 91 fL (ref 80.0–100.0)
Monocytes Absolute: 0.4 10*3/uL (ref 0.1–1.0)
Monocytes Relative: 6 %
Neutro Abs: 3.2 10*3/uL (ref 1.7–7.7)
Neutrophils Relative %: 54 %
Platelets: 305 10*3/uL (ref 150–400)
RBC: 4.91 MIL/uL (ref 3.87–5.11)
RDW: 12.9 % (ref 11.5–15.5)
WBC: 6 10*3/uL (ref 4.0–10.5)
nRBC: 0 % (ref 0.0–0.2)

## 2019-02-07 LAB — TSH: TSH: 3.769 u[IU]/mL (ref 0.350–4.500)

## 2019-02-07 LAB — HEMOGLOBIN A1C
Hgb A1c MFr Bld: 5.3 % (ref 4.8–5.6)
Mean Plasma Glucose: 105.41 mg/dL

## 2019-02-07 LAB — LIPID PANEL
Cholesterol: 248 mg/dL — ABNORMAL HIGH (ref 0–200)
HDL: 63 mg/dL (ref >40)
LDL Cholesterol: 172 mg/dL — ABNORMAL HIGH (ref 0–99)
Total CHOL/HDL Ratio: 3.9 RATIO
Triglycerides: 65 mg/dL (ref <150)
VLDL: 13 mg/dL (ref 0–40)

## 2019-02-08 ENCOUNTER — Telehealth: Payer: Self-pay | Admitting: Family Medicine

## 2019-02-08 ENCOUNTER — Encounter: Payer: Self-pay | Admitting: Family Medicine

## 2019-02-08 NOTE — Telephone Encounter (Signed)
Patient is returning a call to the office regarding her lab results.  Office was already closed when patient called back.  Please call patient tomorrow.

## 2019-02-08 NOTE — Telephone Encounter (Signed)
LMTCB for lab results.  

## 2019-02-08 NOTE — Telephone Encounter (Signed)
Please inform patient the following information: Her labs are all normal, except her cholesterol is very high this time.  Total cholesterol was 206-->248 LDL was 133--> now 172 HDL and triglycerides were good.  The fish oil/omega3 will not help lower her LDL, it helps with HDL and triglycerides. Her LDL goal should be < 130.   - recs:  higher fiber diet and routine exercise. Consume less saturated fats. Would recommend 6 mos follow up and if still elevated would recommend a statin.   We are still waiting the vit d results, they can take a few days

## 2019-02-09 NOTE — Telephone Encounter (Signed)
Spoke with patient to give lab results. Stated verbal understanding. Stated she will work hard on diet.  6 month follow-up has been scheduled.

## 2019-02-09 NOTE — Telephone Encounter (Signed)
Forward to [place] Team --- Please contact patient today with lab results, as she called back after hours yesterday.  Thank you

## 2019-02-10 LAB — 25-HYDROXY VITAMIN D LCMS D2+D3
25-Hydroxy, Vitamin D-3: 40 ng/mL
25-Hydroxy, Vitamin D: 40 ng/mL

## 2019-02-10 LAB — 25-HYDROXYVITAMIN D LCMS D2+D3: 25-Hydroxy, Vitamin D-2: <1 ng/mL

## 2019-02-23 ENCOUNTER — Other Ambulatory Visit: Payer: 59

## 2019-02-27 ENCOUNTER — Other Ambulatory Visit: Payer: Self-pay | Admitting: Family Medicine

## 2019-02-28 MED FILL — CICLOPIROX 0.77% CREAM: 0.77 | 15 days supply | Qty: 15 | Fill #0

## 2019-04-23 MED FILL — OMEPRAZOLE 40 MG CPDR: 40 | 30 days supply | Qty: 30 | Fill #1

## 2019-04-25 ENCOUNTER — Ambulatory Visit
Admission: RE | Admit: 2019-04-25 | Discharge: 2019-04-25 | Disposition: A | Discharge status: Home / Self Care | Payer: 59 | Source: Ambulatory Visit | Attending: Physician Assistant | Admitting: Physician Assistant

## 2019-04-25 ENCOUNTER — Other Ambulatory Visit: Payer: Self-pay

## 2019-04-25 DIAGNOSIS — Z13820 Encounter for screening for osteoporosis: Secondary | ICD-10-CM

## 2019-04-25 DIAGNOSIS — Z780 Asymptomatic menopausal state: Secondary | ICD-10-CM | POA: Diagnosis not present

## 2019-04-25 DIAGNOSIS — Z1231 Encounter for screening mammogram for malignant neoplasm of breast: Secondary | ICD-10-CM

## 2019-04-25 DIAGNOSIS — M810 Age-related osteoporosis without current pathological fracture: Secondary | ICD-10-CM | POA: Diagnosis not present

## 2019-04-25 DIAGNOSIS — M85851 Other specified disorders of bone density and structure, right thigh: Secondary | ICD-10-CM | POA: Diagnosis not present

## 2019-04-30 ENCOUNTER — Telehealth: Payer: Self-pay

## 2019-04-30 NOTE — Telephone Encounter (Signed)
Pt was called and given mammogram results and wanted to know if her Bone density scan was back. Pt was told it was back and still needed to be reviewed by Dr Raoul Pitch as she was on vacation last week. Pt would like detailed message with results left on cellphone VM when results are read.

## 2019-04-30 NOTE — Telephone Encounter (Signed)
Bone density results did not come to me- since they were ordered by her GYN PA- she should call there for results.

## 2019-05-01 NOTE — Telephone Encounter (Signed)
Left detailed message for patient to contact her GYN for results.   Okay per [facility]

## 2019-05-29 MED FILL — TOBRAMYCIN-DEXAMETH OPTH SU: 0.3-0.1 | 12 days supply | Qty: 5 | Fill #0

## 2019-06-15 DIAGNOSIS — R0981 Nasal congestion: Secondary | ICD-10-CM | POA: Diagnosis not present

## 2019-06-15 DIAGNOSIS — Z20828 Contact with and (suspected) exposure to other viral communicable diseases: Secondary | ICD-10-CM | POA: Diagnosis not present

## 2019-06-15 DIAGNOSIS — R0602 Shortness of breath: Secondary | ICD-10-CM | POA: Diagnosis not present

## 2019-06-15 DIAGNOSIS — R5383 Other fatigue: Secondary | ICD-10-CM | POA: Diagnosis not present

## 2019-06-15 DIAGNOSIS — R05 Cough: Secondary | ICD-10-CM | POA: Diagnosis not present

## 2019-08-13 ENCOUNTER — Other Ambulatory Visit: Payer: Self-pay

## 2019-08-13 ENCOUNTER — Ambulatory Visit: Payer: 59 | Admitting: Family Medicine

## 2019-08-13 ENCOUNTER — Encounter: Payer: Self-pay | Admitting: Family Medicine

## 2019-08-13 VITALS — BP 113/76 | HR 63 | Temp 98.8°F | Resp 16 | Ht 68.0 in | Wt 136.0 lb

## 2019-08-13 DIAGNOSIS — M810 Age-related osteoporosis without current pathological fracture: Secondary | ICD-10-CM | POA: Insufficient documentation

## 2019-08-13 DIAGNOSIS — E782 Mixed hyperlipidemia: Secondary | ICD-10-CM

## 2019-08-13 NOTE — Patient Instructions (Addendum)
Osteoporosis  Osteoporosis happens when your bones get thin and weak. This can cause your bones to break (fracture) more easily. You can do things at home to make your bones stronger. Follow these instructions at home:  Activity  Exercise as told by your doctor. Ask your doctor what activities are safe for you. You should do: ? Exercises that make your muscles work to hold your body weight up (weight-bearing exercises). These include tai chi, yoga, and walking. ? Exercises to make your muscles stronger. One example is lifting weights. Lifestyle  Limit alcohol intake to no more than 1 drink a day for nonpregnant women and 2 drinks a day for men. One drink equals 12 oz of beer, 5 oz of wine, or 1 oz of hard liquor.  Do not use any products that have nicotine or tobacco in them. These include cigarettes and e-cigarettes. If you need help quitting, ask your doctor. Preventing falls  Use tools to help you move around (mobility aids) as needed. These include canes, walkers, scooters, and crutches.  Keep rooms well-lit and free of clutter.  Put away things that could make you trip. These include cords and rugs.  Install safety rails on stairs. Install grab bars in bathrooms.  Use rubber mats in slippery areas, like bathrooms.  Wear shoes that: ? Fit you well. ? Support your feet. ? Have closed toes. ? Have rubber soles or low heels.  Tell your doctor about all of the medicines you are taking. Some medicines can make you more likely to fall. General instructions  Eat plenty of calcium and vitamin D. These nutrients are good for your bones. Good sources of calcium and vitamin D include: ? Some fatty fish, such as salmon and tuna. ? Foods that have calcium and vitamin D added to them (fortified foods). For example, some breakfast cereals are fortified with calcium and vitamin D. ? Egg yolks. ? Cheese. ? Liver.  Take over-the-counter and prescription medicines only as told by your  doctor.  Keep all follow-up visits as told by your doctor. This is important. Contact a doctor if:  You have not been tested (screened) for osteoporosis and you are: ? A woman who is age 24 or older. ? A man who is age 70 or older. Get help right away if:  You fall.  You get hurt. Summary  Osteoporosis happens when your bones get thin and weak.  Weak bones can break (fracture) more easily.  Eat plenty of calcium and vitamin D. These nutrients are good for your bones.  Tell your doctor about all of the medicines that you take. This information is not intended to replace advice given to you by your health care provider. Make sure you discuss any questions you have with your health care provider. Document Released: 12/13/2011 Document Revised: 09/02/2017 Document Reviewed: 07/15/2017 Elsevier Patient Education  2020 [employer].   Preventing High Cholesterol Cholesterol is a white, waxy substance similar to fat that the human body needs to help build cells. The liver makes all the cholesterol that a person's body needs. Having high cholesterol (hypercholesterolemia) increases a person's risk for heart disease and stroke. Extra (excess) cholesterol comes from the food the person eats. High cholesterol can often be prevented with diet and lifestyle changes. If you already have high cholesterol, you can control it with diet and lifestyle changes and with medicine. How can high cholesterol affect me? If you have high cholesterol, deposits (plaques) may build up on the walls of  your arteries. The arteries are the blood vessels that carry blood away from your heart. Plaques make the arteries narrower and stiffer. This can limit or block blood flow and cause blood clots to form. Blood clots:  Are tiny balls of cells that form in your blood.  Can move to the heart or brain, causing a heart attack or stroke. Plaques in arteries greatly increase your risk for heart attack and stroke.Making  diet and lifestyle changes can reduce your risk for these conditions that may threaten your life. What can increase my risk? This condition is more likely to develop in people who:  Eat foods that are high in saturated fat or cholesterol. Saturated fat is mostly found in: ? Foods that contain animal fat, such as red meat and some dairy products. ? Certain fatty foods made from plants, such as tropical oils.  Are overweight.  Are not getting enough exercise.  Have a family history of high cholesterol. What actions can I take to prevent this? Nutrition   Eat less saturated fat.  Avoid trans fats (partially hydrogenated oils). These are often found in margarine and in some baked goods, fried foods, and snacks bought in packages.  Avoid precooked or cured meat, such as sausages or meat loaves.  Avoid foods and drinks that have added sugars.  Eat more fruits, vegetables, and whole grains.  Choose healthy sources of protein, such as fish, poultry, lean cuts of red meat, beans, peas, lentils, and nuts.  Choose healthy sources of fat, such as: ? Nuts. ? Vegetable oils, especially olive oil. ? Fish that have healthy fats (omega-3 fatty acids), such as mackerel or salmon. The items listed above may not be a complete list of recommended foods and beverages. Contact a dietitian for more information. Lifestyle  Lose weight if you are overweight. Losing 5-10 lb (2.3-4.5 kg) can help prevent or control high cholesterol. It can also lower your risk for diabetes and high blood pressure. Ask your health care provider to help you with a diet and exercise plan to lose weight safely.  Do not use any products that contain nicotine or tobacco, such as cigarettes, e-cigarettes, and chewing tobacco. If you need help quitting, ask your health care provider.  Limit your alcohol intake. ? Do not drink alcohol if:  Your health care provider tells you not to drink.  You are pregnant, may be pregnant,  or are planning to become pregnant. ? If you drink alcohol:  Limit how much you use to:  0-1 drink a day for women.  0-2 drinks a day for men.  Be aware of how much alcohol is in your drink. In the [place] one drink equals one 12 oz bottle of beer (355 mL), one 5 oz glass of wine (148 mL), or one 1 oz glass of hard liquor (44 mL). Activity   Get enough exercise. Each week, do at least 150 minutes of exercise that takes a medium level of effort (moderate-intensity exercise). ? This is exercise that:  Makes your heart beat faster and makes you breathe harder than usual.  Allows you to still be able to talk. ? You could exercise in short sessions several times a day or longer sessions a few times a week. For example, on 5 days each week, you could walk fast or ride your bike 3 times a day for 10 minutes each time.  Do exercises as told by your health care provider. Medicines  In addition to diet and lifestyle changes,  your health care provider may recommend medicines to help lower cholesterol. This may be a medicine to lower the amount of cholesterol your liver makes. You may need medicine if: ? Diet and lifestyle changes do not lower your cholesterol enough. ? You have high cholesterol and other risk factors for heart disease or stroke.  Take over-the-counter and prescription medicines only as told by your health care provider. General information  Manage your risk factors for high cholesterol. Talk with your health care provider about all your risk factors and how to lower your risk.  Manage other conditions that you have, such as diabetes or high blood pressure (hypertension).  Have blood tests to check your cholesterol levels at regular points in time as told by your health care provider.  Keep all follow-up visits as told by your health care provider. This is important. Where to find more information  American Heart Association: www.heart.org  National Heart, Lung, and  Blood Institute: https://wilson-eaton.com/ Summary  High cholesterol increases your risk for heart disease and stroke. By keeping your cholesterol level low, you can reduce your risk for these conditions.  High cholesterol can often be prevented with diet and lifestyle changes.  Work with your health care provider to manage your risk factors, and have your blood tested regularly. This information is not intended to replace advice given to you by your health care provider. Make sure you discuss any questions you have with your health care provider. Document Released: 10/05/2015 Document Revised: 01/12/2019 Document Reviewed: 05/29/2016 Elsevier Patient Education  2020 [employer].

## 2019-08-13 NOTE — Progress Notes (Signed)
Robin Henry , [birth year], 63 y.o., female MRN: [id number] Patient Care Team    Relationship Specialty Notifications Start End  Ma Hillock, DO PCP - General Family Medicine  10/29/16   Jerrell Belfast, MD Consulting Physician Otolaryngology  11/15/16   Clarene Essex, MD Consulting Physician Gastroenterology  04/25/18     Chief Complaint  Patient presents with  . Follow-up    6 month follow up. patient stated that she is here for labs. she stated that she changed her diet     Subjective: Pt presents for an OV for follow up hyperlipidemia. She reports she has changed her diet, eating less saturated fat and increase the amount of fish and vegetables in her diet.  She is exercising with Pilates routinely.  She is taking fish oil supplementation.  Osteoporosis: Reviewed patient's DEXA scan ordered by her gynecologist in July/2020 with osteoporosis as finding.  This is new for her.  Prior scans were osteopenia only.  She is taking a vitamin D supplementation, although she is not certain what the dose is.  She would like to think about use of Fosamax, she is concerned secondary to her reflux.  Lipid Panel     Component Value Date/Time   CHOL 248 (H) 02/07/2019 0900   TRIG 65 02/07/2019 0900   TRIG 37 09/08/2006 0945   HDL 63 02/07/2019 0900   CHOLHDL 3.9 02/07/2019 0900   VLDL 13 02/07/2019 0900   LDLCALC 172 (H) 02/07/2019 0900   LDLDIRECT 125.8 09/12/2013 0934     Depression screen PHQ 2/9 02/06/2019 02/20/2018  Decreased Interest 0 0  Down, Depressed, Hopeless 0 0  PHQ - 2 Score 0 0    No Known Allergies Social History   Social History Narrative   Widow, no children.   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  [place] resident since 1989.   Occup: CRNA   No T/A/Ds.   Exercises >3 X/week.   Past Medical History:  Diagnosis Date  . Chronic paronychia of finger of left hand 10/10/2018  . Hoarseness 2018   with cough; laryngoscopy by Dr. Wilburn Cornelia showed all normal except  postglottic erythema c/w reflux.  Pt started on PPI q 5 PM.  If cough persists, consider referral to [facility] cough [facility].  . Laryngopharyngeal reflux (LPR) 11/2016  . Macular dystrophy    Hx of detached retina (Dr. Baird Cancer)  . Osteoarthritis, multiple sites    Primarily knees   Past Surgical History:  Procedure Laterality Date  . ABDOMINAL HYSTERECTOMY  2001   fibroids  . COLONOSCOPY  09/08/2012   Normal.  Recall 10 yrs.  Procedure: COLONOSCOPY;  Surgeon: Jeryl Columbia, MD;  Location: [facility];  Service: Endoscopy;  Laterality: N/A;  wants no sedation-but start iv  . UPPER GASTROINTESTINAL ENDOSCOPY  04/17/2018   neg H.Pylori, terminal ileum eroded chronic active ileitis negative for dysplasia, CMV neg.    Family History  Problem Relation Age of Onset  . Hypertension Mother   . Emphysema Mother   . Cancer Mother        cartoid  . Colon cancer Paternal Grandfather   . Alcohol abuse Sister   . Alcohol abuse Maternal Grandmother   . Lung cancer Maternal Grandmother    Allergies as of 08/13/2019   No Known Allergies     Medication List       Accurate as of August 13, 2019  3:15 PM. If you have any questions, ask your nurse or doctor.  STOP taking these medications   ciclopirox 0.77 % cream Commonly known as: LOPROX Stopped by: Howard Pouch, DO     TAKE these medications   CALCIUM 1200 PO Take by mouth 2 (two) times daily.   fish oil-omega-3 fatty acids 1000 MG capsule Take 2 g by mouth daily.   omeprazole 40 MG capsule Commonly known as: PRILOSEC Take 1 capsule (40 mg total) by mouth daily. What changed:   when to take this  reasons to take this   VISION-VITE PRESERVE PO Take by mouth daily.   VITAMIN D (CHOLECALCIFEROL) PO Take 5,000 mg by mouth 1 day or 1 dose.   Zinc 100 MG Tabs Take 1 tablet by mouth daily.       All past medical history, surgical history, allergies, family history, immunizations andmedications were updated in the EMR  today and reviewed under the history and medication portions of their EMR.     ROS: Negative, with the exception of above mentioned in HPI   Objective:  BP 113/76 (BP Location: Left Arm, Patient Position: Sitting, Cuff Size: Normal)   Pulse 63   Temp 98.8 F (37.1 C) (Temporal)   Resp 16   Ht 5\' 8"  (1.727 m)   Wt 136 lb (61.7 kg)   SpO2 96%   BMI 20.68 kg/m  Body mass index is 20.68 kg/m. Gen: Afebrile. No acute distress. Nontoxic in appearance, well developed, well nourished.  Very pleasant, Caucasian female. HENT: AT. [place].  Eyes:Pupils Equal Round Reactive to light, Extraocular movements intact,  Conjunctiva without redness, discharge or icterus. CV: RRR  Chest: CTAB, no wheeze or crackles. Good air movement, normal resp effort.  Neuro:  Normal gait. PERLA. EOMi. Alert. Oriented x3   No exam data present No results found. No results found for this or any previous visit (from the past 24 hour(s)).  Assessment/Plan: Robin Henry is a 63 y.o. female present for OV for  Mixed hyperlipidemia -She has made dietary and exercise changes.   - She is taking fish oil supplement.  - Lipid panel -Patient will be called and results discussed, along with plan if needed.  Osteoporosis without current pathological fracture, unspecified osteoporosis type Discussed results of her bone density scan, which was ordered by her gynecologist, with her today.  Reviewed results in EMR.  Encouraged her to continue exercise, weightbearing exercise is very helpful for her osteoporosis.  She is taking a vitamin D supplement but is not certain on the dose.  She will check this when she gets home and have the dose available we call her back. - Vitamin D (25 hydroxy) -Could consider Fosamax, although currently she would like to avoid taking the medication if possible.    Reviewed expectations re: course of current medical issues.  Discussed self-management of symptoms.  Outlined signs and symptoms  indicating need for more acute intervention.  Patient verbalized understanding and all questions were answered.  Patient received an After-Visit Summary.    Orders Placed This Encounter  Procedures  . Lipid panel   > 25 minutes spent with patient, >50% of time spent face to face     Note is dictated utilizing voice recognition software. Although note has been proof read prior to signing, occasional typographical errors still can be missed. If any questions arise, please do not hesitate to call for verification.   electronically signed by:  Howard Pouch, DO  [place]

## 2019-08-14 ENCOUNTER — Telehealth: Payer: Self-pay | Admitting: Family Medicine

## 2019-08-14 DIAGNOSIS — M810 Age-related osteoporosis without current pathological fracture: Secondary | ICD-10-CM

## 2019-08-14 LAB — LIPID PANEL
Cholesterol: 210 mg/dL — ABNORMAL HIGH (ref <200)
HDL: 62 mg/dL (ref >=50)
LDL Cholesterol (Calc): 133 mg/dL (calc) — ABNORMAL HIGH
Non-HDL Cholesterol (Calc): 148 mg/dL (calc) — ABNORMAL HIGH (ref <130)
Total CHOL/HDL Ratio: 3.4 (calc) (ref <5.0)
Triglycerides: 56 mg/dL (ref <150)

## 2019-08-14 LAB — VITAMIN D 25 HYDROXY (VIT D DEFICIENCY, FRACTURES): Vit D, 25-Hydroxy: 52 ng/mL (ref 30–100)

## 2019-08-14 MED ORDER — ALENDRONATE SODIUM 70 MG PO TABS: 70.0000 mg | ORAL_TABLET | ORAL | 11 refills | Status: DC

## 2019-08-14 NOTE — Telephone Encounter (Signed)
-----   Message from Caroll Rancher, LPN sent at [phone]  1:26 PM EST ----- Pt was called and given results, she is not home and unsure of the dose of Vit D. She will check and call back. Pt is also wanting to start Fosamax now. She would like sent to [facility].  Please advise.

## 2019-08-14 NOTE — Telephone Encounter (Signed)
Fosamax prescribed. Please explain it once a WEEK pill, must be taken with full glass of water and must remain upright (no laying down) for 30 min after taking medication.

## 2019-08-15 MED FILL — ALENDRONATE NA 70 MG TAB: 70 | 28 days supply | Qty: 4 | Fill #0

## 2019-08-15 NOTE — Telephone Encounter (Signed)
Pt was called and detailed instructions were left on patients VM on how to take medication. Pt was asked to call back if she had any questions/concerns

## 2019-08-23 DIAGNOSIS — M810 Age-related osteoporosis without current pathological fracture: Secondary | ICD-10-CM | POA: Diagnosis not present

## 2019-08-23 DIAGNOSIS — Z6821 Body mass index (BMI) 21.0-21.9, adult: Secondary | ICD-10-CM | POA: Diagnosis not present

## 2019-08-23 DIAGNOSIS — Z01419 Encounter for gynecological examination (general) (routine) without abnormal findings: Secondary | ICD-10-CM | POA: Diagnosis not present

## 2019-08-23 DIAGNOSIS — E785 Hyperlipidemia, unspecified: Secondary | ICD-10-CM | POA: Diagnosis not present

## 2019-08-24 DIAGNOSIS — Z01419 Encounter for gynecological examination (general) (routine) without abnormal findings: Secondary | ICD-10-CM | POA: Diagnosis not present

## 2019-09-05 DIAGNOSIS — H35412 Lattice degeneration of retina, left eye: Secondary | ICD-10-CM | POA: Diagnosis not present

## 2019-09-05 DIAGNOSIS — H3554 Dystrophies primarily involving the retinal pigment epithelium: Secondary | ICD-10-CM | POA: Diagnosis not present

## 2019-09-05 DIAGNOSIS — D3132 Benign neoplasm of left choroid: Secondary | ICD-10-CM | POA: Diagnosis not present

## 2019-09-05 DIAGNOSIS — H43811 Vitreous degeneration, right eye: Secondary | ICD-10-CM | POA: Diagnosis not present

## 2019-09-07 ENCOUNTER — Telehealth: Payer: Self-pay | Admitting: Family Medicine

## 2019-09-07 DIAGNOSIS — M810 Age-related osteoporosis without current pathological fracture: Secondary | ICD-10-CM

## 2019-09-07 NOTE — Telephone Encounter (Signed)
Pt was called and given information. She was told we would call her once the approval was completed.

## 2019-09-07 NOTE — Telephone Encounter (Signed)
Patient is requesting the injection form of Fosamax, she having reactions with tablet form  Patient can be reached at [phone]  Thank you

## 2019-09-07 NOTE — Telephone Encounter (Signed)
Please advise patient we would have to go through a prior authorization process for her.  Once we hear back from them, we will call and let her know the decision.  If it is approved she will be scheduled for nurse visit for Prolia injection every 6 months.  I believe Lattie Haw is the one that works on [employer].  Please forward this to her.  Patient has failed bisphosphonate therapy secondary to side effects.

## 2019-09-07 NOTE — Telephone Encounter (Signed)
Pt was called and said she is having pain in her throat and difficulty swallowing x1 week since taking Fosamax. Pt took last Fosamax pill on November 26th and has had these symptoms ever since. She states it has gotten better and thinks it is from the medication. Pt admits to have hiatal hernia but says she would rather stop Fosamax and use injectable just in case. Please advise.   Pt was told that if she had swelling in lips, tongue, or throat, difficulty breathing, or got worse she would need to call 911, she verbalized understanding and said she did not need to do that.

## 2019-09-10 NOTE — Telephone Encounter (Signed)
Insurance verification process started, awaiting approval and cost estimation.

## 2019-09-11 MED FILL — OMEPRAZOLE 40 MG CPDR: 40 | 30 days supply | Qty: 30 | Fill #2

## 2019-09-19 MED FILL — PROLIA 60 MG/ML SOLN: 60 | 180 days supply | Qty: 1 | Fill #0

## 2019-10-02 MED FILL — PROLIA 60 MG/ML SOLN: 60 | 180 days supply | Qty: 1 | Fill #0

## 2019-10-11 DIAGNOSIS — M810 Age-related osteoporosis without current pathological fracture: Secondary | ICD-10-CM | POA: Diagnosis not present

## 2019-10-11 DIAGNOSIS — Z20822 Contact with and (suspected) exposure to covid-19: Secondary | ICD-10-CM | POA: Diagnosis not present

## 2019-10-15 NOTE — Telephone Encounter (Signed)
Finally received insurance verification and it shows she would have a $300 deductible to meet first and then each injection would be $50.

## 2019-10-15 NOTE — Telephone Encounter (Signed)
Please call patient and inform her of findings.  If she would like to proceed with the Prolia injections please set her up for her first injection and schedule her every 6 months. Thanks.

## 2019-10-15 NOTE — Telephone Encounter (Signed)
Patient states she already got Prolia through her GYN.

## 2019-12-31 ENCOUNTER — Telehealth: Payer: Self-pay | Admitting: Gastroenterology

## 2019-12-31 ENCOUNTER — Other Ambulatory Visit: Payer: Self-pay

## 2019-12-31 DIAGNOSIS — E785 Hyperlipidemia, unspecified: Secondary | ICD-10-CM

## 2019-12-31 NOTE — Telephone Encounter (Signed)
Dr. Lyndel Safe, We received a lab order from [facility] PA-C [phone])  For this pt. She is requesting: Hyperlipidemia/ ICD:10E78.5  Lipid panel,serum/ [facility]. To be performed on or around 12/28/2019. Please advise. Will send and  fax over lab order request.

## 2019-12-31 NOTE — Telephone Encounter (Signed)
Can go ahead and get it done RG

## 2020-01-16 ENCOUNTER — Other Ambulatory Visit (HOSPITAL_COMMUNITY)
Admission: RE | Admit: 2020-01-16 | Discharge: 2020-01-16 | Disposition: A | Discharge status: Home / Self Care | Payer: 59 | Source: Ambulatory Visit | Attending: Physician Assistant | Admitting: Physician Assistant

## 2020-01-16 DIAGNOSIS — E785 Hyperlipidemia, unspecified: Secondary | ICD-10-CM | POA: Diagnosis not present

## 2020-01-16 LAB — LIPID PANEL
Cholesterol: 225 mg/dL — ABNORMAL HIGH (ref 0–200)
HDL: 65 mg/dL (ref >40)
LDL Cholesterol: 149 mg/dL — ABNORMAL HIGH (ref 0–99)
Total CHOL/HDL Ratio: 3.5 RATIO
Triglycerides: 53 mg/dL (ref <150)
VLDL: 11 mg/dL (ref 0–40)

## 2020-02-04 DIAGNOSIS — M19011 Primary osteoarthritis, right shoulder: Secondary | ICD-10-CM | POA: Diagnosis not present

## 2020-02-04 DIAGNOSIS — M25511 Pain in right shoulder: Secondary | ICD-10-CM | POA: Diagnosis not present

## 2020-03-11 DIAGNOSIS — M25511 Pain in right shoulder: Secondary | ICD-10-CM | POA: Diagnosis not present

## 2020-06-12 ENCOUNTER — Other Ambulatory Visit: Payer: Self-pay

## 2020-07-21 ENCOUNTER — Other Ambulatory Visit (HOSPITAL_BASED_OUTPATIENT_CLINIC_OR_DEPARTMENT_OTHER): Payer: Self-pay | Admitting: Internal Medicine

## 2020-07-21 ENCOUNTER — Ambulatory Visit: Payer: Self-pay | Attending: Internal Medicine

## 2020-07-21 DIAGNOSIS — Z23 Encounter for immunization: Secondary | ICD-10-CM

## 2020-07-21 NOTE — Progress Notes (Signed)
   Covid-19 Vaccination Clinic  Name:  Robin Henry    MRN: [id number] DOB: September 02, 1956  07/21/2020  [name] was observed post Covid-19 immunization for 15 minutes without incident. She was provided with Vaccine Information Sheet and instruction to access the V-Safe system. Vaccinated by Harriet Pho.  [name] was instructed to call 911 with any severe reactions post vaccine: Marland Kitchen Difficulty breathing  . Swelling of face and throat  . A fast heartbeat  . A bad rash all over body  . Dizziness and weakness

## 2020-07-29 MED FILL — PFIZER-BIONTECH COVID-19 VA: 30 | 1 days supply | Qty: 0 | Fill #0

## 2020-08-14 DIAGNOSIS — H5213 Myopia, bilateral: Secondary | ICD-10-CM | POA: Diagnosis not present

## 2020-08-18 DIAGNOSIS — D485 Neoplasm of uncertain behavior of skin: Secondary | ICD-10-CM | POA: Diagnosis not present

## 2020-08-18 DIAGNOSIS — L821 Other seborrheic keratosis: Secondary | ICD-10-CM | POA: Diagnosis not present

## 2020-08-18 DIAGNOSIS — L84 Corns and callosities: Secondary | ICD-10-CM | POA: Diagnosis not present

## 2020-08-18 DIAGNOSIS — L570 Actinic keratosis: Secondary | ICD-10-CM | POA: Diagnosis not present

## 2020-08-18 DIAGNOSIS — B078 Other viral warts: Secondary | ICD-10-CM | POA: Diagnosis not present

## 2020-08-18 DIAGNOSIS — L814 Other melanin hyperpigmentation: Secondary | ICD-10-CM | POA: Diagnosis not present

## 2020-08-18 DIAGNOSIS — D1801 Hemangioma of skin and subcutaneous tissue: Secondary | ICD-10-CM | POA: Diagnosis not present

## 2020-09-10 DIAGNOSIS — H3554 Dystrophies primarily involving the retinal pigment epithelium: Secondary | ICD-10-CM | POA: Diagnosis not present

## 2020-09-10 DIAGNOSIS — H43811 Vitreous degeneration, right eye: Secondary | ICD-10-CM | POA: Diagnosis not present

## 2020-09-10 DIAGNOSIS — H35412 Lattice degeneration of retina, left eye: Secondary | ICD-10-CM | POA: Diagnosis not present

## 2020-09-10 DIAGNOSIS — D3132 Benign neoplasm of left choroid: Secondary | ICD-10-CM | POA: Diagnosis not present

## 2020-09-10 MED FILL — PROLIA 60 MG/ML SOLN: 60 | 180 days supply | Qty: 1 | Fill #0

## 2020-09-15 DIAGNOSIS — Z13228 Encounter for screening for other metabolic disorders: Secondary | ICD-10-CM | POA: Diagnosis not present

## 2020-09-15 DIAGNOSIS — M810 Age-related osteoporosis without current pathological fracture: Secondary | ICD-10-CM | POA: Diagnosis not present

## 2020-09-15 DIAGNOSIS — Z800 Family history of malignant neoplasm of digestive organs: Secondary | ICD-10-CM | POA: Diagnosis not present

## 2020-09-15 DIAGNOSIS — Z01419 Encounter for gynecological examination (general) (routine) without abnormal findings: Secondary | ICD-10-CM | POA: Diagnosis not present

## 2020-09-24 ENCOUNTER — Ambulatory Visit (HOSPITAL_BASED_OUTPATIENT_CLINIC_OR_DEPARTMENT_OTHER): Payer: 59 | Admitting: Pharmacist

## 2020-09-24 ENCOUNTER — Other Ambulatory Visit: Payer: Self-pay

## 2020-09-24 DIAGNOSIS — Z79899 Other long term (current) drug therapy: Secondary | ICD-10-CM

## 2020-09-24 MED ORDER — DENOSUMAB 60 MG/ML ~~LOC~~ SOSY: PREFILLED_SYRINGE | SUBCUTANEOUS | 1 refills | Status: DC

## 2020-09-24 NOTE — Progress Notes (Signed)
S:  Patient presents for review of their specialty medication therapy.  Patient is currently taking Prolia for osteoporosis. Patient is managed by Dr. Governor Specking for this.   Adherence: reported. She has had 1 injection already.   Efficacy: reports that she believes this is working well for her  Dosing: 60 mg q27months   Dose adjustments: Renal: Monitor patients with severe impairment (CrCl <30 mL/minute or on dialysis) closely, as significant and prolonged hypocalcemia (incidence of 29% and potentially lasting weeks to months) and marked elevations of serum parathyroid hormone are serious risks in this population. Ensure adequate calcium and vitamin D intake/supplementation. CrCl ?30 mL/minute: No dosage adjustment necessary. CrCl <30 mL/minute: No dosage adjustment necessary; use in conjunction with guidance from patient's nephrology team. Hepatic: no dose adjustments (has not been studied)  Drug-drug interactions: none  Monitorng: S/sx of infection: none S/sx of hypersensitivity: none S/sx of hypocalcemia/hypercalcemia: none Dermatitis/skin rash: none Peripheral edema: none HA: none GI upset: none   Other side effects: none  Last bone density study: 04/25/2019   O:      Lab Results  Component Value Date   WBC 6.0 02/07/2019   HGB 14.4 02/07/2019   HCT 44.7 02/07/2019   MCV 91.0 02/07/2019   PLT 305 02/07/2019      Chemistry      Component Value Date/Time   NA 142 02/07/2019 0900   K 4.1 02/07/2019 0900   CL 107 02/07/2019 0900   CO2 27 02/07/2019 0900   BUN 15 02/07/2019 0900   CREATININE 0.80 02/07/2019 0900      Component Value Date/Time   CALCIUM 9.5 02/07/2019 0900   ALKPHOS 67 02/20/2018 1003   AST 13 02/20/2018 1003   ALT 13 02/20/2018 1003   BILITOT 0.9 02/20/2018 1003       A/P: 1. Medication review: Patient currently on Prolia for osteoporosis. Reviewed the medication with the patient, including the following: Prolia (denosumab) is a monoclonal  antibody with affinity for nuclear factor-kappa ligand (RANKL). Prolia binds to RANKL and prevents osteoclast formation, leading to decreased bone resorption and increased bone mass in osteoporosis. Patient educated on purpose, proper use, and potential adverse effects of Prolia. The most common adverse effects are hypersensitivities, peripheral edema, dermatitis/skin rash, GI upset, HA, joint pain, and infection. There is the possibility of atypical femur fracture, serum calcium disturbances, and osteonecrosis of the jaw. Patients should monitor for and report hip, thigh, or groin pain. Additionally, patients should monitor for and report jaw pain, tooth/periodontal infection, toothache, and/or gingival ulceration/erosion. Prolia exists as a solution prefilled syringe for SQ administration. Administration: Denosumab is intended for SubQ route only and should not be administered IV, IM, or intradermally. Prior to administration, bring to room temperature in original container (allow to stand ~15 to 30 minutes); do not warm by any other method. Solution may contain trace amounts of translucent to white protein particles; do not use if cloudy, discolored (normal solution should be clear and colorless to pale yellow), or contains excessive particles or foreign matter. Avoid vigorous shaking. Administer via SubQ injection in the upper arm, upper thigh, or abdomen; should only be administered by a health care professional. No recommendations for any changes at this time.  Benard Halsted, PharmD, [place], [place] [phone]

## 2020-11-17 DIAGNOSIS — R059 Cough, unspecified: Secondary | ICD-10-CM | POA: Diagnosis not present

## 2020-11-17 DIAGNOSIS — M791 Myalgia, unspecified site: Secondary | ICD-10-CM | POA: Diagnosis not present

## 2020-11-17 DIAGNOSIS — Z20822 Contact with and (suspected) exposure to covid-19: Secondary | ICD-10-CM | POA: Diagnosis not present

## 2020-11-17 DIAGNOSIS — J069 Acute upper respiratory infection, unspecified: Secondary | ICD-10-CM | POA: Diagnosis not present

## 2020-12-30 ENCOUNTER — Other Ambulatory Visit (HOSPITAL_COMMUNITY): Payer: Self-pay

## 2021-03-09 ENCOUNTER — Other Ambulatory Visit (HOSPITAL_COMMUNITY): Payer: Self-pay

## 2021-03-10 ENCOUNTER — Encounter: Payer: Self-pay | Admitting: Family Medicine

## 2021-03-10 ENCOUNTER — Telehealth (INDEPENDENT_AMBULATORY_CARE_PROVIDER_SITE_OTHER): Payer: 59 | Admitting: Family Medicine

## 2021-03-10 VITALS — Temp 99.8°F

## 2021-03-10 DIAGNOSIS — U071 COVID-19: Secondary | ICD-10-CM

## 2021-03-10 DIAGNOSIS — R059 Cough, unspecified: Secondary | ICD-10-CM

## 2021-03-10 MED ORDER — HYDROCODONE BIT-HOMATROP MBR 5-1.5 MG/5ML PO SOLN: 5.0000 mL | Freq: Three times a day (TID) | ORAL | 0 refills | Status: DC | PRN

## 2021-03-10 MED ORDER — NIRMATRELVIR/RITONAVIR (PAXLOVID)TABLET: 3.0000 | ORAL_TABLET | Freq: Two times a day (BID) | ORAL | 0 refills | Status: AC

## 2021-03-10 NOTE — Progress Notes (Signed)
VIRTUAL VISIT VIA VIDEO  I connected with Lissa Morales on 03/10/21 at 11:30 AM EDT by elemedicine application and verified that I am speaking with the correct person using two identifiers. Location patient: Home Location provider: [facility], Office Persons participating in the virtual visit: Patient, Dr. Raoul Pitch and Darnell Level. Cesar, CMA  I discussed the limitations of evaluation and management by telemedicine and the availability of in person appointments. The patient expressed understanding and agreed to proceed.   SUBJECTIVE Chief Complaint  Patient presents with  . Covid Positive    Pt c/o cough, low grade temp (101.2), sore throat, HA, congestion, fatigue , nausea x 2 days; tested at home 6/5    HPI: Robin Henry is a 65 y.o. female present for covid illness.  Sx start: 6/5 Exposure: mother/sister in [place] Tested + 6/5 Vaccine:pfizer x3 (last > 6 mos) Pt endorses cough, fever (101.2), sore throat. Headache, congestion, fatigue, nausea. Pt denies shortness of breath. Otc: ibf, nyquil PM GFR: >60 Anticoag:n/a  ROS: See pertinent positives and negatives per HPI.  Patient Active Problem List   Diagnosis Date Noted  . Osteoporosis 08/13/2019  . Gastroesophageal reflux disease 02/06/2019  . Chronic constipation 02/06/2019  . Vitamin D deficiency 02/03/2018    Social History   Tobacco Use  . Smoking status: Never Smoker  . Smokeless tobacco: Never Used  Substance Use Topics  . Alcohol use: Yes    Comment: rare    Current Outpatient Medications:  .  Calcium Carbonate-Vit D-Min (CALCIUM 1200 PO), Take by mouth 2 (two) times daily., Disp: , Rfl:  .  denosumab (PROLIA) 60 MG/ML SOSY injection, Inject 1 syringe under the skin every 6 months. Patient will bring to provider's office for administration., Disp: 1 mL, Rfl: 1 .  fish oil-omega-3 fatty acids 1000 MG capsule, Take 2 g by mouth daily., Disp: , Rfl:  .  HYDROcodone bit-homatropine (HYCODAN) 5-1.5 MG/5ML syrup,  Take 5 mLs by mouth every 8 (eight) hours as needed for cough., Disp: 120 mL, Rfl: 0 .  Multiple Vitamins-Minerals (VISION-VITE PRESERVE PO), Take by mouth daily., Disp: , Rfl:  .  nirmatrelvir/ritonavir EUA (PAXLOVID) TABS, Take 3 tablets by mouth 2 (two) times daily for 5 days. (Take nirmatrelvir 150 mg two tablets twice daily for 5 days and ritonavir 100 mg one tablet twice daily for 5 days) Patient GFR is > 60, Disp: 30 tablet, Rfl: 0 .  VITAMIN D, CHOLECALCIFEROL, PO, Take 5,000 mg by mouth 1 day or 1 dose. , Disp: , Rfl:   No Known Allergies  OBJECTIVE: Temp 99.8 F (37.7 C) (Oral)  Gen: No acute distress. Nontoxic in appearance.  HENT: AT. [place].  MMM.  Eyes:Pupils Equal Round Reactive to light, Extraocular movements intact,  Conjunctiva without redness, discharge or icterus. Chest: Cough present. No shortness of breath.  Skin: no rashes, purpura or petechiae.  Neuro:  Normal gait. Alert. Oriented x3  Psych: Normal affect and demeanor. Normal speech. Normal thought content and judgment.  ASSESSMENT AND PLAN: Robin Henry is a 65 y.o. female present for  COVID-19/Cough Rest, hydrate.  mucinex (DM if cough), nettie pot or nasal saline.  paxlovid prescribed, take until completed.  Hycodan cough syrup. F/U 2 weeks if not improved.   Howard Pouch, DO 03/10/2021   Return in about 1 week (around 03/17/2021), or if symptoms worsen or fail to improve.  No orders of the defined types were placed in this encounter.  Meds ordered this encounter  Medications  . nirmatrelvir/ritonavir EUA (PAXLOVID) TABS    Sig: Take 3 tablets by mouth 2 (two) times daily for 5 days. (Take nirmatrelvir 150 mg two tablets twice daily for 5 days and ritonavir 100 mg one tablet twice daily for 5 days) Patient GFR is > 60    Dispense:  30 tablet    Refill:  0  . HYDROcodone bit-homatropine (HYCODAN) 5-1.5 MG/5ML syrup    Sig: Take 5 mLs by mouth every 8 (eight) hours as needed for cough.    Dispense:  120  mL    Refill:  0   Referral Orders  No referral(s) requested today

## 2021-03-10 NOTE — Patient Instructions (Signed)

## 2021-03-11 ENCOUNTER — Other Ambulatory Visit (HOSPITAL_COMMUNITY): Payer: Self-pay

## 2021-03-23 ENCOUNTER — Other Ambulatory Visit (HOSPITAL_COMMUNITY): Payer: Self-pay

## 2021-03-23 MED ORDER — CARESTART COVID-19 HOME TEST VI KIT
PACK | 0 refills | Status: DC
  Filled 2021-03-23: qty 4, 4d supply, fill #0

## 2021-03-30 ENCOUNTER — Other Ambulatory Visit (HOSPITAL_COMMUNITY): Payer: Self-pay

## 2021-04-01 ENCOUNTER — Encounter: Payer: Self-pay | Admitting: Family Medicine

## 2021-04-07 ENCOUNTER — Other Ambulatory Visit (HOSPITAL_COMMUNITY): Payer: Self-pay

## 2021-04-14 ENCOUNTER — Other Ambulatory Visit (HOSPITAL_COMMUNITY): Payer: Self-pay

## 2021-04-14 MED ORDER — ATOVAQUONE-PROGUANIL HCL 250-100 MG PO TABS
1.0000 | ORAL_TABLET | Freq: Every day | ORAL | 0 refills | Status: DC
  Filled 2021-04-14: qty 17, 17d supply, fill #0

## 2021-04-14 MED ORDER — AZITHROMYCIN 250 MG PO TABS
ORAL_TABLET | ORAL | 0 refills | Status: DC
  Filled 2021-04-14: qty 8, 4d supply, fill #0

## 2021-04-14 MED ORDER — ACETAZOLAMIDE 125 MG PO TABS
125.0000 mg | ORAL_TABLET | Freq: Two times a day (BID) | ORAL | 0 refills | Status: DC
  Filled 2021-04-14: qty 14, 7d supply, fill #0

## 2021-05-08 ENCOUNTER — Other Ambulatory Visit (HOSPITAL_COMMUNITY): Payer: Self-pay

## 2021-06-03 ENCOUNTER — Other Ambulatory Visit: Payer: Self-pay | Admitting: Family Medicine

## 2021-06-03 ENCOUNTER — Other Ambulatory Visit (HOSPITAL_COMMUNITY): Payer: Self-pay

## 2021-06-03 DIAGNOSIS — Z1231 Encounter for screening mammogram for malignant neoplasm of breast: Secondary | ICD-10-CM

## 2021-06-03 DIAGNOSIS — Z13820 Encounter for screening for osteoporosis: Secondary | ICD-10-CM

## 2021-06-03 MED ORDER — DENOSUMAB 60 MG/ML ~~LOC~~ SOSY
PREFILLED_SYRINGE | SUBCUTANEOUS | 0 refills | Status: DC
  Filled 2021-06-03: qty 60, 180d supply, fill #0
  Filled 2021-06-03: qty 1, 180d supply, fill #0

## 2021-06-04 ENCOUNTER — Other Ambulatory Visit: Payer: 59

## 2021-06-09 DIAGNOSIS — Z1231 Encounter for screening mammogram for malignant neoplasm of breast: Secondary | ICD-10-CM | POA: Diagnosis not present

## 2021-06-09 LAB — HM MAMMOGRAPHY

## 2021-06-11 ENCOUNTER — Other Ambulatory Visit (HOSPITAL_COMMUNITY): Payer: Self-pay

## 2021-06-12 ENCOUNTER — Other Ambulatory Visit (HOSPITAL_COMMUNITY): Payer: Self-pay

## 2021-06-19 ENCOUNTER — Telehealth: Payer: Self-pay

## 2021-06-19 NOTE — Telephone Encounter (Signed)
Received Mammogram results from [facility] on 06/19/21. Will place on PCP desk for review.

## 2021-06-23 ENCOUNTER — Other Ambulatory Visit: Payer: Self-pay | Admitting: Specialist

## 2021-06-24 ENCOUNTER — Other Ambulatory Visit: Payer: Self-pay

## 2021-06-24 DIAGNOSIS — R928 Other abnormal and inconclusive findings on diagnostic imaging of breast: Secondary | ICD-10-CM

## 2021-06-29 ENCOUNTER — Other Ambulatory Visit: Payer: Self-pay

## 2021-06-29 DIAGNOSIS — R928 Other abnormal and inconclusive findings on diagnostic imaging of breast: Secondary | ICD-10-CM

## 2021-07-09 ENCOUNTER — Other Ambulatory Visit: Payer: Self-pay | Admitting: Specialist

## 2021-07-16 ENCOUNTER — Other Ambulatory Visit: Payer: Self-pay

## 2021-07-16 ENCOUNTER — Ambulatory Visit
Admission: RE | Admit: 2021-07-16 | Discharge: 2021-07-16 | Disposition: A | Discharge status: Home / Self Care | Payer: 59 | Source: Ambulatory Visit | Attending: *Deleted | Admitting: *Deleted

## 2021-07-16 DIAGNOSIS — R928 Other abnormal and inconclusive findings on diagnostic imaging of breast: Secondary | ICD-10-CM

## 2021-07-16 DIAGNOSIS — R922 Inconclusive mammogram: Secondary | ICD-10-CM | POA: Diagnosis not present

## 2021-07-16 DIAGNOSIS — N6489 Other specified disorders of breast: Secondary | ICD-10-CM | POA: Diagnosis not present

## 2021-07-29 ENCOUNTER — Ambulatory Visit: Payer: 59

## 2021-08-17 DIAGNOSIS — H5213 Myopia, bilateral: Secondary | ICD-10-CM | POA: Diagnosis not present

## 2021-08-21 ENCOUNTER — Other Ambulatory Visit (HOSPITAL_COMMUNITY): Payer: Self-pay

## 2021-09-11 DIAGNOSIS — Z01419 Encounter for gynecological examination (general) (routine) without abnormal findings: Secondary | ICD-10-CM | POA: Diagnosis not present

## 2021-09-18 DIAGNOSIS — Z01419 Encounter for gynecological examination (general) (routine) without abnormal findings: Secondary | ICD-10-CM | POA: Diagnosis not present

## 2021-09-18 DIAGNOSIS — Z6821 Body mass index (BMI) 21.0-21.9, adult: Secondary | ICD-10-CM | POA: Diagnosis not present

## 2021-09-18 DIAGNOSIS — R1032 Left lower quadrant pain: Secondary | ICD-10-CM | POA: Diagnosis not present

## 2021-09-23 ENCOUNTER — Other Ambulatory Visit: Payer: Self-pay

## 2021-09-23 ENCOUNTER — Ambulatory Visit: Payer: 59 | Attending: Family Medicine | Admitting: Pharmacist

## 2021-09-23 DIAGNOSIS — Z79899 Other long term (current) drug therapy: Secondary | ICD-10-CM

## 2021-09-23 NOTE — Progress Notes (Signed)
S:  Patient presents for review of their specialty medication therapy.  Patient is currently taking Prolia for osteoporosis. Patient is managed by Dr. Governor Specking for this.   Adherence: reported.   Efficacy: reports that she believes this is working well for her. She has a BMD scheduled for next month.   Dosing: 60 mg q73months   Dose adjustments: Renal: Monitor patients with severe impairment (CrCl <30 mL/minute or on dialysis) closely, as significant and prolonged hypocalcemia (incidence of 29% and potentially lasting weeks to months) and marked elevations of serum parathyroid hormone are serious risks in this population. Ensure adequate calcium and vitamin D intake/supplementation. CrCl ?30 mL/minute: No dosage adjustment necessary. CrCl <30 mL/minute: No dosage adjustment necessary; use in conjunction with guidance from patient's nephrology team. Hepatic: no dose adjustments (has not been studied)  Drug-drug interactions: none  Monitorng: S/sx of infection: none S/sx of hypersensitivity: none S/sx of hypocalcemia/hypercalcemia: none Dermatitis/skin rash: none Peripheral edema: none HA: none GI upset: none   Other side effects: none  Last bone density study: has one upcoming next month.    O:      Lab Results  Component Value Date   WBC 6.0 02/07/2019   HGB 14.4 02/07/2019   HCT 44.7 02/07/2019   MCV 91.0 02/07/2019   PLT 305 02/07/2019      Chemistry      Component Value Date/Time   NA 142 02/07/2019 0900   K 4.1 02/07/2019 0900   CL 107 02/07/2019 0900   CO2 27 02/07/2019 0900   BUN 15 02/07/2019 0900   CREATININE 0.80 02/07/2019 0900      Component Value Date/Time   CALCIUM 9.5 02/07/2019 0900   ALKPHOS 67 02/20/2018 1003   AST 13 02/20/2018 1003   ALT 13 02/20/2018 1003   BILITOT 0.9 02/20/2018 1003       A/P: 1. Medication review: Patient currently on Prolia for osteoporosis. Reviewed the medication with the patient, including the following: Prolia  (denosumab) is a monoclonal antibody with affinity for nuclear factor-kappa ligand (RANKL). Prolia binds to RANKL and prevents osteoclast formation, leading to decreased bone resorption and increased bone mass in osteoporosis. Patient educated on purpose, proper use, and potential adverse effects of Prolia. The most common adverse effects are hypersensitivities, peripheral edema, dermatitis/skin rash, GI upset, HA, joint pain, and infection. There is the possibility of atypical femur fracture, serum calcium disturbances, and osteonecrosis of the jaw. Patients should monitor for and report hip, thigh, or groin pain. Additionally, patients should monitor for and report jaw pain, tooth/periodontal infection, toothache, and/or gingival ulceration/erosion. Prolia exists as a solution prefilled syringe for SQ administration. Administration: Denosumab is intended for SubQ route only and should not be administered IV, IM, or intradermally. Prior to administration, bring to room temperature in original container (allow to stand ~15 to 30 minutes); do not warm by any other method. Solution may contain trace amounts of translucent to white protein particles; do not use if cloudy, discolored (normal solution should be clear and colorless to pale yellow), or contains excessive particles or foreign matter. Avoid vigorous shaking. Administer via SubQ injection in the upper arm, upper thigh, or abdomen; should only be administered by a health care professional. No recommendations for any changes at this time.  Benard Halsted, PharmD, [place], [place] [phone]

## 2021-10-06 ENCOUNTER — Other Ambulatory Visit (HOSPITAL_BASED_OUTPATIENT_CLINIC_OR_DEPARTMENT_OTHER): Payer: Self-pay

## 2021-10-13 ENCOUNTER — Other Ambulatory Visit: Payer: Self-pay

## 2021-10-13 ENCOUNTER — Other Ambulatory Visit (HOSPITAL_COMMUNITY): Payer: Self-pay

## 2021-10-13 ENCOUNTER — Other Ambulatory Visit (HOSPITAL_COMMUNITY): Payer: Self-pay | Admitting: Nurse Practitioner

## 2021-10-13 ENCOUNTER — Other Ambulatory Visit: Payer: Self-pay | Admitting: Nurse Practitioner

## 2021-10-13 DIAGNOSIS — R1030 Lower abdominal pain, unspecified: Secondary | ICD-10-CM

## 2021-10-16 ENCOUNTER — Other Ambulatory Visit (HOSPITAL_COMMUNITY): Payer: Self-pay

## 2021-10-19 ENCOUNTER — Other Ambulatory Visit (HOSPITAL_COMMUNITY): Payer: Self-pay

## 2021-10-19 ENCOUNTER — Other Ambulatory Visit: Payer: Self-pay

## 2021-10-20 ENCOUNTER — Other Ambulatory Visit (HOSPITAL_COMMUNITY): Payer: Self-pay

## 2021-10-20 ENCOUNTER — Ambulatory Visit (INDEPENDENT_AMBULATORY_CARE_PROVIDER_SITE_OTHER): Payer: 59 | Admitting: Family Medicine

## 2021-10-20 ENCOUNTER — Encounter: Payer: Self-pay | Admitting: Family Medicine

## 2021-10-20 VITALS — BP 112/75 | HR 86 | Temp 97.5°F | Ht 66.75 in | Wt 139.6 lb

## 2021-10-20 DIAGNOSIS — E559 Vitamin D deficiency, unspecified: Secondary | ICD-10-CM

## 2021-10-20 DIAGNOSIS — Z0000 Encounter for general adult medical examination without abnormal findings: Secondary | ICD-10-CM | POA: Diagnosis not present

## 2021-10-20 DIAGNOSIS — K5909 Other constipation: Secondary | ICD-10-CM | POA: Diagnosis not present

## 2021-10-20 DIAGNOSIS — Z131 Encounter for screening for diabetes mellitus: Secondary | ICD-10-CM

## 2021-10-20 DIAGNOSIS — Z130 Encounter for screening for diseases of the blood and blood-forming organs and certain disorders involving the immune mechanism: Secondary | ICD-10-CM | POA: Diagnosis not present

## 2021-10-20 DIAGNOSIS — Z23 Encounter for immunization: Secondary | ICD-10-CM | POA: Diagnosis not present

## 2021-10-20 DIAGNOSIS — M810 Age-related osteoporosis without current pathological fracture: Secondary | ICD-10-CM

## 2021-10-20 DIAGNOSIS — Z13220 Encounter for screening for lipoid disorders: Secondary | ICD-10-CM | POA: Diagnosis not present

## 2021-10-20 DIAGNOSIS — K219 Gastro-esophageal reflux disease without esophagitis: Secondary | ICD-10-CM | POA: Diagnosis not present

## 2021-10-20 MED ORDER — DICLOFENAC SODIUM 75 MG PO TBEC
75.0000 mg | DELAYED_RELEASE_TABLET | Freq: Two times a day (BID) | ORAL | 5 refills | Status: DC
  Filled 2021-10-20: qty 60, 30d supply, fill #0

## 2021-10-20 NOTE — Progress Notes (Signed)
This visit occurred during the SARS-CoV-2 public health emergency.  Safety protocols were in place, including screening questions prior to the visit, additional usage of staff PPE, and extensive cleaning of exam room while observing appropriate contact time as indicated for disinfecting solutions.    Patient ID: Robin Henry, female  DOB: 1956/07/28, 66 y.o.   MRN: [id number] Patient Care Team    Relationship Specialty Notifications Start End  Ma Hillock, DO PCP - General Family Medicine  10/29/16   Jerrell Belfast, MD Consulting Physician Otolaryngology  11/15/16   Clarene Essex, MD Consulting Physician Gastroenterology  04/25/18   Denton Meek  Gynecology  10/20/21   Macarthur Critchley, [place] Referring Physician Optometry  10/20/21   Hollar, Katharine Look, MD Referring Physician Dermatology  10/20/21   Sherlynn Stalls, MD Consulting Physician Ophthalmology  10/20/21     Chief Complaint  Patient presents with   Annual Exam    Physical     Subjective: Robin Henry is a 66 y.o.  Female  present for CPE  All past medical history, surgical history, allergies, family history, immunizations, medications and social history were updated in the electronic medical record today. All recent labs, ED visits and hospitalizations within the last year were reviewed.  Health maintenance:  Colonoscopy: completed 04/13/2018, by [facility], follow up 10 year. Fhx colon cancer.  Mammogram: completed: 07/2021 birads 1. [facility] Cervical cancer screening: Hysterectomy> GYN Immunizations: tdap UTD 2016, Influenza UTD 07/2021(encouraged yearly),zostavax 11/14/2015. Shingrix x1? only 2019. Covid series completed. PNA20 provided today Infectious disease screening: Hep C and HIV completed DEXA: last completed 2020 scheduled >11/24/2021 (osteoporosis) Assistive device: none Oxygen RAX:ENMM Patient has a Dental home. Hospitalizations/ED visits: reviewed  Depression screen [facility] 2/9 10/20/2021 03/10/2021 02/06/2019 02/20/2018   Decreased Interest 0 0 0 0  Down, Depressed, Hopeless 0 0 0 0  PHQ - 2 Score 0 0 0 0   No flowsheet data found.  Immunization History  Administered Date(s) Administered   Influenza-Unspecified 07/04/2017, 07/02/2021   PFIZER(Purple Top)SARS-COV-2 Vaccination 09/23/2019, 10/11/2019, 07/21/2020   PNEUMOCOCCAL CONJUGATE-20 10/20/2021   Pfizer Covid-19 Vaccine Bivalent Booster 69yr & up 09/10/2021   Tdap 04/25/2015   Zoster Recombinat (Shingrix) 02/21/2018   Zoster, Live 11/14/2015   Past Medical History:  Diagnosis Date   Chronic paronychia of finger of left hand 10/10/2018   Foot cramps 02/06/2019   Hoarseness 2018   with cough; laryngoscopy by Dr. SWilburn Corneliashowed all normal except postglottic erythema c/w reflux.  Pt started on PPI q 5 PM.  If cough persists, consider referral to [facility] cough [facility].   Laryngopharyngeal reflux (LPR) 11/2016   Macular dystrophy    Hx of detached retina (Dr. SB[facility]   Osteoarthritis, multiple sites    Primarily knees   Tinea corporis 02/06/2019   No Known Allergies Past Surgical History:  Procedure Laterality Date   ABDOMINAL HYSTERECTOMY  2001   fibroids   COLONOSCOPY  09/08/2012   Normal.  Recall 10 yrs.  Procedure: COLONOSCOPY;  Surgeon: MJeryl Columbia MD;  Location: [facility];  Service: Endoscopy;  Laterality: N/A;  wants no sedation-but start iv   UPPER GASTROINTESTINAL ENDOSCOPY  04/17/2018   neg H.Pylori, terminal ileum eroded chronic active ileitis negative for dysplasia, CMV neg.    Family History  Problem Relation Age of Onset   Hypertension Mother    Emphysema Mother    Cancer Mother        cartoid   Colon cancer Paternal Grandfather  Alcohol abuse Sister    Alcohol abuse Maternal Grandmother    Lung cancer Maternal Grandmother    Social History   Social History Narrative   ** Merged History Encounter **       Widow, no children. 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  [place] resident since 1989. 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 Exercises >3 X/week.     Allergies as of 10/20/2021   No Known Allergies      Medication List        Accurate as of October 20, 2021  1:37 PM. If you have any questions, ask your nurse or doctor.          STOP taking these medications    acetaZOLAMIDE 125 MG tablet Commonly known as: DIAMOX Stopped by: Howard Pouch, DO   atovaquone-proguanil 250-100 MG Tabs tablet Commonly known as: MALARONE Stopped by: Howard Pouch, DO   azithromycin 250 MG tablet Commonly known as: ZITHROMAX Stopped by: Howard Pouch, DO   Carestart COVID-19 Home Test Kit Generic drug: COVID-19 At Home Antigen Test Stopped by: Howard Pouch, DO   HYDROcodone bit-homatropine 5-1.5 MG/5ML syrup Commonly known as: HYCODAN Stopped by: Howard Pouch, DO       TAKE these medications    CALCIUM 1200 PO Take by mouth 2 (two) times daily.   fish oil-omega-3 fatty acids 1000 MG capsule Take 2 g by mouth daily.   Prolia 60 MG/ML Sosy injection Generic drug: denosumab Bring to the doctors office for administration every six months. What changed: Another medication with the same name was removed. Continue taking this medication, and follow the directions you see here. Changed by: Howard Pouch, DO   TURMERIC PO Take 1 capsule by mouth 2 (two) times daily.   VISION-VITE PRESERVE PO Take by mouth daily.   VITAMIN D (CHOLECALCIFEROL) PO Take 5,000 mg by mouth 1 day or 1 dose.        All past medical history, surgical history, allergies, family history, immunizations andmedications were updated in the EMR today and reviewed under the history and medication portions of their EMR.     No results found for this or any previous visit (from the past 2160 hour(s)).  US BREAST LTD UNI LEFT INC AXILLA Result Date: 07/16/2021 CLINICAL DATA:  Patient recalled from screening for left breast asymmetry. EXAM: DIGITAL DIAGNOSTIC UNILATERAL LEFT MAMMOGRAM WITH TOMOSYNTHESIS AND CAD; ULTRASOUND LEFT  BREAST LIMITED TECHNIQUE: Left digital diagnostic mammography and breast tomosynthesis was performed. The images were evaluated with computer-aided detection.; Targeted ultrasound examination of the left breast was performed. COMPARISON:  Previous exam(s). ACR Breast Density Category c: The breast tissue is heterogeneously dense, which may obscure small masses. FINDINGS: Questioned asymmetry within the retroareolar left breast predominately resolved with additional imaging suggestive of dense fibroglandular tissue. Given the residual density in this location, ultrasound will be performed. Targeted ultrasound is performed, showing normal tissue without suspicious mass within the retroareolar left breast. IMPRESSION: No mammographic evidence for malignancy. RECOMMENDATION: Screening mammogram in one year.(Code:SM-B-01Y) I have discussed the findings and recommendations with the patient. If applicable, a reminder letter will be sent to the patient regarding the next appointment. BI-RADS CATEGORY  1: Negative. Electronically Signed   By: Lovey Newcomer M.D.   On: 07/16/2021 09:57 MM DIAG BREAST TOMO UNI LEFT Result Date: 07/16/2021 CLINICAL DATA:  Patient recalled from screening for left breast asymmetry. EXAM: DIGITAL DIAGNOSTIC UNILATERAL LEFT MAMMOGRAM WITH TOMOSYNTHESIS AND CAD; ULTRASOUND LEFT BREAST LIMITED TECHNIQUE: Left digital diagnostic mammography and breast tomosynthesis  was performed. The images were evaluated with computer-aided detection.; Targeted ultrasound examination of the left breast was performed. COMPARISON:  Previous exam(s). ACR Breast Density Category c: The breast tissue is heterogeneously dense, which may obscure small masses. FINDINGS: Questioned asymmetry within the retroareolar left breast predominately resolved with additional imaging suggestive of dense fibroglandular tissue. Given the residual density in this location, ultrasound will be performed. Targeted ultrasound is performed,  showing normal tissue without suspicious mass within the retroareolar left breast. IMPRESSION: No mammographic evidence for malignancy. RECOMMENDATION: Screening mammogram in one year.(Code:SM-B-01Y) I have discussed the findings and recommendations with the patient. If applicable, a reminder letter will be sent to the patient regarding the next appointment. BI-RADS CATEGORY  1: Negative. Electronically Signed   By: Lovey Newcomer M.D.   On: 07/16/2021 09:57    ROS 14 pt review of systems performed and negative (unless mentioned in an HPI)  Objective:  BP 112/75 (BP Location: Right Arm, Patient Position: Sitting, Cuff Size: Normal)    Pulse 86    Temp (!) 97.5 F (36.4 C) (Oral)    Ht 5' 6.75" (1.695 m)    Wt 139 lb 9.6 oz (63.3 kg)    SpO2 95%    BMI 22.03 kg/m  Physical Exam Vitals and nursing note reviewed.  Constitutional:      General: She is not in acute distress.    Appearance: Normal appearance. She is not ill-appearing or toxic-appearing.  HENT:     Head: Normocephalic and atraumatic.     Right Ear: Tympanic membrane, ear canal and external ear normal. There is no impacted cerumen.     Left Ear: Tympanic membrane, ear canal and external ear normal. There is no impacted cerumen.     Nose: No congestion or rhinorrhea.     Mouth/Throat:     Mouth: Mucous membranes are moist.     Pharynx: Oropharynx is clear. No oropharyngeal exudate or posterior oropharyngeal erythema.  Eyes:     General: No scleral icterus.       Right eye: No discharge.        Left eye: No discharge.     Extraocular Movements: Extraocular movements intact.     Conjunctiva/sclera: Conjunctivae normal.     Pupils: Pupils are equal, round, and reactive to light.  Cardiovascular:     Rate and Rhythm: Normal rate and regular rhythm.     Pulses: Normal pulses.     Heart sounds: Normal heart sounds. No murmur heard.   No friction rub. No gallop.  Pulmonary:     Effort: Pulmonary effort is normal. No respiratory  distress.     Breath sounds: Normal breath sounds. No stridor. No wheezing, rhonchi or rales.  Chest:     Chest wall: No tenderness.  Abdominal:     General: Abdomen is flat. Bowel sounds are normal. There is no distension.     Palpations: Abdomen is soft. There is no mass.     Tenderness: There is no abdominal tenderness. There is no right CVA tenderness, left CVA tenderness, guarding or rebound.     Hernia: No hernia is present.  Musculoskeletal:        General: No swelling, tenderness or deformity. Normal range of motion.     Cervical back: Normal range of motion and neck supple. No rigidity or tenderness.     Right lower leg: No edema.     Left lower leg: No edema.  Lymphadenopathy:     Cervical: No cervical  adenopathy.  Skin:    General: Skin is warm and dry.     Coloration: Skin is not jaundiced or pale.     Findings: No bruising, erythema, lesion or rash.  Neurological:     General: No focal deficit present.     Mental Status: She is alert and oriented to person, place, and time. Mental status is at baseline.     Cranial Nerves: No cranial nerve deficit.     Sensory: No sensory deficit.     Motor: No weakness.     Coordination: Coordination normal.     Gait: Gait normal.     Deep Tendon Reflexes: Reflexes normal.  Psychiatric:        Mood and Affect: Mood normal.        Behavior: Behavior normal.        Thought Content: Thought content normal.        Judgment: Judgment normal.     No results found.  Assessment/plan: Robin Henry is a 66 y.o. female present for CPE  Vitamin D deficiency - VITAMIN D 25 Hydroxy (Vit-D Deficiency, Fractures) Chronic constipation - TSH Osteoporosis without current pathological fracture, unspecified osteoporosis type DEXA completed - TSH - VITAMIN D 25 Hydroxy (Vit-D Deficiency, Fractures) Diabetes mellitus screening - Comprehensive metabolic panel - Hemoglobin A1c Screening for deficiency anemia - CBC with  Differential/Platelet Lipid screening - Lipid panel Need for pneumococcal vaccination - Pneumococcal conjugate vaccine 20-valent (Prevnar 20) Routine general medical examination at a health care facility Colonoscopy: completed 04/13/2018, by [facility], follow up 10 year. Fhx colon cancer.  Mammogram: completed: 07/2021 birads 1. [facility] Cervical cancer screening: Hysterectomy> GYN Immunizations: tdap UTD 2016, Influenza UTD 07/2021(encouraged yearly),zostavax 11/14/2015. Shingrix x1? only 2019. Covid series completed. PNA20 provided today Infectious disease screening: Hep C and HIV completed DEXA: last completed 2020 scheduled >11/24/2021 (osteoporosis) Patient was encouraged to exercise greater than 150 minutes a week. Patient was encouraged to choose a diet filled with fresh fruits and vegetables, and lean meats. AVS provided to patient today for education/recommendation on gender specific health and safety maintenance.  Return in about 1 year (around 10/21/2022) for CPE (30 min).  Orders Placed This Encounter  Procedures   Pneumococcal conjugate vaccine 20-valent (Prevnar 20)   CBC with Differential/Platelet   Comprehensive metabolic panel   Hemoglobin A1c   Lipid panel   TSH   VITAMIN D 25 Hydroxy (Vit-D Deficiency, Fractures)   No orders of the defined types were placed in this encounter.  Referral Orders  No referral(s) requested today     Electronically signed by: Howard Pouch, [place]

## 2021-10-20 NOTE — Patient Instructions (Signed)
Great to see you today.  I have refilled the medication(s) we provide.   If labs were collected, we will inform you of lab results once received either by echart message or telephone call.   - echart message- for normal results that have been seen by the patient already.   - telephone call: abnormal results or if patient has not viewed results in their echart. Health Maintenance After Age 67 After age 33, you are at a higher risk for certain long-term diseases and infections as well as injuries from falls. Falls are a major cause of broken bones and head injuries in people who are older than age 87. Getting regular preventive care can help to keep you healthy and well. Preventive care includes getting regular testing and making lifestyle changes as recommended by your health care provider. Talk with your health care provider about: Which screenings and tests you should have. A screening is a test that checks for a disease when you have no symptoms. A diet and exercise plan that is right for you. What should I know about screenings and tests to prevent falls? Screening and testing are the best ways to find a health problem early. Early diagnosis and treatment give you the best chance of managing medical conditions that are common after age 60. Certain conditions and lifestyle choices may make you more likely to have a fall. Your health care provider may recommend: Regular vision checks. Poor vision and conditions such as cataracts can make you more likely to have a fall. If you wear glasses, make sure to get your prescription updated if your vision changes. Medicine review. Work with your health care provider to regularly review all of the medicines you are taking, including over-the-counter medicines. Ask your health care provider about any side effects that may make you more likely to have a fall. Tell your health care provider if any medicines that you take make you feel dizzy or sleepy. Strength  and balance checks. Your health care provider may recommend certain tests to check your strength and balance while standing, walking, or changing positions. Foot health exam. Foot pain and numbness, as well as not wearing proper footwear, can make you more likely to have a fall. Screenings, including: Osteoporosis screening. Osteoporosis is a condition that causes the bones to get weaker and break more easily. Blood pressure screening. Blood pressure changes and medicines to control blood pressure can make you feel dizzy. Depression screening. You may be more likely to have a fall if you have a fear of falling, feel depressed, or feel unable to do activities that you used to do. Alcohol use screening. Using too much alcohol can affect your balance and may make you more likely to have a fall. Follow these instructions at home: Lifestyle Do not drink alcohol if: Your health care provider tells you not to drink. If you drink alcohol: Limit how much you have to: 0-1 drink a day for women. 0-2 drinks a day for men. Know how much alcohol is in your drink. In the [place] one drink equals one 12 oz bottle of beer (355 mL), one 5 oz glass of wine (148 mL), or one 1 oz glass of hard liquor (44 mL). Do not use any products that contain nicotine or tobacco. These products include cigarettes, chewing tobacco, and vaping devices, such as e-cigarettes. If you need help quitting, ask your health care provider. Activity  Follow a regular exercise program to stay fit. This will help you maintain  your balance. Ask your health care provider what types of exercise are appropriate for you. If you need a cane or walker, use it as recommended by your health care provider. Wear supportive shoes that have nonskid soles. Safety  Remove any tripping hazards, such as rugs, cords, and clutter. Install safety equipment such as grab bars in bathrooms and safety rails on stairs. Keep rooms and walkways well-lit. General  instructions Talk with your health care provider about your risks for falling. Tell your health care provider if: You fall. Be sure to tell your health care provider about all falls, even ones that seem minor. You feel dizzy, tiredness (fatigue), or off-balance. Take over-the-counter and prescription medicines only as told by your health care provider. These include supplements. Eat a healthy diet and maintain a healthy weight. A healthy diet includes low-fat dairy products, low-fat (lean) meats, and fiber from whole grains, beans, and lots of fruits and vegetables. Stay current with your vaccines. Schedule regular health, dental, and eye exams. Summary Having a healthy lifestyle and getting preventive care can help to protect your health and wellness after age 23. Screening and testing are the best way to find a health problem early and help you avoid having a fall. Early diagnosis and treatment give you the best chance for managing medical conditions that are more common for people who are older than age 50. Falls are a major cause of broken bones and head injuries in people who are older than age 62. Take precautions to prevent a fall at home. Work with your health care provider to learn what changes you can make to improve your health and wellness and to prevent falls. This information is not intended to replace advice given to you by your health care provider. Make sure you discuss any questions you have with your health care provider. Document Revised: 02/09/2021 Document Reviewed: 02/09/2021 Elsevier Patient Education  [place].

## 2021-10-21 ENCOUNTER — Ambulatory Visit (HOSPITAL_COMMUNITY)
Admission: RE | Admit: 2021-10-21 | Discharge: 2021-10-21 | Disposition: A | Discharge status: Home / Self Care | Payer: 59 | Source: Ambulatory Visit | Attending: Obstetrics and Gynecology | Admitting: Obstetrics and Gynecology

## 2021-10-21 ENCOUNTER — Other Ambulatory Visit (HOSPITAL_COMMUNITY): Payer: Self-pay

## 2021-10-21 ENCOUNTER — Other Ambulatory Visit: Payer: Self-pay

## 2021-10-21 ENCOUNTER — Telehealth: Payer: Self-pay | Admitting: Family Medicine

## 2021-10-21 ENCOUNTER — Encounter: Payer: Self-pay | Admitting: Family Medicine

## 2021-10-21 DIAGNOSIS — R1030 Lower abdominal pain, unspecified: Secondary | ICD-10-CM | POA: Diagnosis not present

## 2021-10-21 DIAGNOSIS — R102 Pelvic and perineal pain: Secondary | ICD-10-CM | POA: Diagnosis not present

## 2021-10-21 DIAGNOSIS — Z90710 Acquired absence of both cervix and uterus: Secondary | ICD-10-CM | POA: Diagnosis not present

## 2021-10-21 LAB — COMPREHENSIVE METABOLIC PANEL
AG Ratio: 1.8 (calc) (ref 1.0–2.5)
ALT: 10 U/L (ref 6–29)
AST: 14 U/L (ref 10–35)
Albumin: 4.2 g/dL (ref 3.6–5.1)
Alkaline phosphatase (APISO): 52 U/L (ref 37–153)
BUN: 12 mg/dL (ref 7–25)
CO2: 28 mmol/L (ref 20–32)
Calcium: 9.6 mg/dL (ref 8.6–10.4)
Chloride: 104 mmol/L (ref 98–110)
Creat: 0.85 mg/dL (ref 0.50–1.05)
Globulin: 2.4 g/dL (calc) (ref 1.9–3.7)
Glucose, Bld: 97 mg/dL (ref 65–99)
Potassium: 3.8 mmol/L (ref 3.5–5.3)
Sodium: 140 mmol/L (ref 135–146)
Total Bilirubin: 0.5 mg/dL (ref 0.2–1.2)
Total Protein: 6.6 g/dL (ref 6.1–8.1)

## 2021-10-21 LAB — VITAMIN D 25 HYDROXY (VIT D DEFICIENCY, FRACTURES): Vit D, 25-Hydroxy: 37 ng/mL (ref 30–100)

## 2021-10-21 LAB — HEMOGLOBIN A1C
Hgb A1c MFr Bld: 5.3 % of total Hgb (ref <5.7)
Mean Plasma Glucose: 105 mg/dL
eAG (mmol/L): 5.8 mmol/L

## 2021-10-21 LAB — CBC WITH DIFFERENTIAL/PLATELET
Absolute Monocytes: 508 cells/uL (ref 200–950)
Basophils Absolute: 49 cells/uL (ref 0–200)
Basophils Relative: 0.6 %
Eosinophils Absolute: 197 cells/uL (ref 15–500)
Eosinophils Relative: 2.4 %
HCT: 40.4 % (ref 35.0–45.0)
Hemoglobin: 13.7 g/dL (ref 11.7–15.5)
Lymphs Abs: 2370 cells/uL (ref 850–3900)
MCH: 29.3 pg (ref 27.0–33.0)
MCHC: 33.9 g/dL (ref 32.0–36.0)
MCV: 86.3 fL (ref 80.0–100.0)
MPV: 10.8 fL (ref 7.5–12.5)
Monocytes Relative: 6.2 %
Neutro Abs: 5076 cells/uL (ref 1500–7800)
Neutrophils Relative %: 61.9 %
Platelets: 285 10*3/uL (ref 140–400)
RBC: 4.68 10*6/uL (ref 3.80–5.10)
RDW: 12.2 % (ref 11.0–15.0)
Total Lymphocyte: 28.9 %
WBC: 8.2 10*3/uL (ref 3.8–10.8)

## 2021-10-21 LAB — LIPID PANEL
Cholesterol: 219 mg/dL — ABNORMAL HIGH (ref <200)
HDL: 55 mg/dL (ref >=50)
LDL Cholesterol (Calc): 141 mg/dL (calc) — ABNORMAL HIGH
Non-HDL Cholesterol (Calc): 164 mg/dL (calc) — ABNORMAL HIGH (ref <130)
Total CHOL/HDL Ratio: 4 (calc) (ref <5.0)
Triglycerides: 114 mg/dL (ref <150)

## 2021-10-21 LAB — TSH: TSH: 2.63 mIU/L (ref 0.40–4.50)

## 2021-10-21 NOTE — Telephone Encounter (Signed)
Please call patient Liver, kidney and thyroid function are normal Blood cell counts and electrolytes are normal Diabetes screening/A1c is normal  Vit d is normal.  Cholesterol panel overall is ok. Her LDL/bad cholesterol is higher this year 141. Per risk calculation this placed her at a 5% risk of MI or stroke in 10 years.  Ideally, her LDL should be at least less than 130 and closer to 100 would be past. Options are: 1.  Start a low-dose statin, such as Lipitor before bed.  This helps lower her cholesterol and also provides the extra cardiovascular protection only the statin group provides. 2.  Start a medication called Zetia.  This medication is not a statin, therefore the extra cardiovascular protection would not be provided, but it does focus on lowering the LDL. 3.  We discussed her starting red yeast rice supplement.  She can start the supplement, and we can have her back in 6 months to recheck her cholesterol and see if it is lowering her LDL enough.  Of course we recommend routine exercise of at least 150 minutes a week and consuming a heart healthy diet full of fresh fruits, fresh vegetables and lean not fatty meats.  Please advise of her decision and if electing to start one of the medication above, we will call that in for her when she has made her selection.  We would want to follow-up with her in 3 months if starting a prescribed medication.  She should come fasting to that appointment.  If she is starting the supplement and behavior modifications only, we would follow-up in 6 months with provider-fasting for recheck.

## 2021-10-22 ENCOUNTER — Other Ambulatory Visit (HOSPITAL_COMMUNITY): Payer: Self-pay

## 2021-10-22 NOTE — Telephone Encounter (Signed)
FYI. Please see below.  Spoke with pt regarding results/recommendations,voiced understanding. She elected to try red yeast rice supplement and follow up in 6 months for recheck. If she changes her mind about meds, she will call back.

## 2021-10-22 NOTE — Telephone Encounter (Signed)
LM for pt to return call to discuss.  

## 2021-10-23 NOTE — Telephone Encounter (Signed)
noted 

## 2021-10-26 ENCOUNTER — Other Ambulatory Visit (HOSPITAL_COMMUNITY): Payer: Self-pay

## 2021-10-26 MED ORDER — PRESERVISION AREDS 2 PO CAPS
ORAL_CAPSULE | ORAL | 6 refills | Status: AC
  Filled 2021-10-26: qty 120, 60d supply, fill #0

## 2021-10-26 NOTE — Telephone Encounter (Signed)
Please advise if rx can be sent.

## 2021-10-26 NOTE — Telephone Encounter (Signed)
She has decided to try the red yeast rice supplement.  She states insurance will pay for it.  Please call to [facility].  Please call patient when completed.

## 2021-10-27 ENCOUNTER — Other Ambulatory Visit (HOSPITAL_COMMUNITY): Payer: Self-pay

## 2021-10-27 MED ORDER — RED YEAST RICE 600 MG PO CAPS
1200.0000 mg | ORAL_CAPSULE | Freq: Every day | ORAL | 1 refills | Status: DC
Start: 2021-10-27 — End: 2022-03-05
  Filled 2021-10-27: qty 180, 90d supply, fill #0

## 2021-10-27 NOTE — Telephone Encounter (Signed)
Completed.

## 2021-10-27 NOTE — Addendum Note (Signed)
Addended by: Howard Pouch A on: 10/27/2021 12:29 PM   Modules accepted: Orders

## 2021-11-18 ENCOUNTER — Other Ambulatory Visit (HOSPITAL_COMMUNITY): Payer: Self-pay

## 2021-11-18 MED ORDER — AMOXICILLIN-POT CLAVULANATE 875-125 MG PO TABS
ORAL_TABLET | ORAL | 1 refills | Status: DC
  Filled 2021-11-18: qty 20, 10d supply, fill #0
  Filled 2021-11-30: qty 20, 10d supply, fill #1

## 2021-11-18 MED ORDER — CHLORHEXIDINE GLUCONATE 0.12 % MT SOLN
OROMUCOSAL | 0 refills | Status: DC
  Filled 2021-11-18: qty 473, 15d supply, fill #0

## 2021-11-18 MED ORDER — DOXYCYCLINE HYCLATE 100 MG PO CAPS
ORAL_CAPSULE | ORAL | 1 refills | Status: DC
  Filled 2021-11-18: qty 30, 30d supply, fill #0

## 2021-11-20 ENCOUNTER — Other Ambulatory Visit (HOSPITAL_COMMUNITY): Payer: Self-pay

## 2021-11-23 ENCOUNTER — Other Ambulatory Visit (HOSPITAL_COMMUNITY): Payer: Self-pay

## 2021-11-23 ENCOUNTER — Other Ambulatory Visit: Payer: Self-pay | Admitting: Pharmacist

## 2021-11-23 MED ORDER — PROLIA 60 MG/ML ~~LOC~~ SOSY: PREFILLED_SYRINGE | SUBCUTANEOUS | 0 refills | Status: DC

## 2021-11-23 MED ORDER — PROLIA 60 MG/ML ~~LOC~~ SOSY
PREFILLED_SYRINGE | SUBCUTANEOUS | 0 refills | Status: DC
  Filled 2021-11-23: qty 1, 180d supply, fill #0

## 2021-11-24 ENCOUNTER — Other Ambulatory Visit: Payer: 59

## 2021-11-24 ENCOUNTER — Other Ambulatory Visit: Payer: Self-pay

## 2021-11-24 ENCOUNTER — Ambulatory Visit
Admission: RE | Admit: 2021-11-24 | Discharge: 2021-11-24 | Disposition: A | Discharge status: Home / Self Care | Payer: 59 | Source: Ambulatory Visit | Attending: Family Medicine | Admitting: Family Medicine

## 2021-11-24 DIAGNOSIS — M85851 Other specified disorders of bone density and structure, right thigh: Secondary | ICD-10-CM | POA: Diagnosis not present

## 2021-11-24 DIAGNOSIS — Z780 Asymptomatic menopausal state: Secondary | ICD-10-CM | POA: Diagnosis not present

## 2021-11-24 DIAGNOSIS — M810 Age-related osteoporosis without current pathological fracture: Secondary | ICD-10-CM | POA: Diagnosis not present

## 2021-11-24 DIAGNOSIS — Z13820 Encounter for screening for osteoporosis: Secondary | ICD-10-CM

## 2021-11-25 NOTE — Telephone Encounter (Signed)
Pt was given call regarding other results. She mentioned she was taking bergamot 500mg   2 capsules a day instead of red yeast rice. She would like to have cholesterol rechecked at the end of March to see if the treatment is helping. Pt advised 3 month lab appt recommended if she was to start on Zetia or statin.   Please review and advise

## 2021-11-25 NOTE — Telephone Encounter (Signed)
If not starting a prescribed medication to lower her cholesterol recommendations are for her to follow-up in 5-6 months with provider and cholesterol recheck. Nonprescribed medications take longer to make changes desired.  Prescribed medications are checked in approximately 3 months for 2 reasons, 1 we checked the LFTs with those medications starts and to it only takes about 12 weeks to see the changes with the prescribed medication.  March would be too soon to see the full effect of an over-the-counter med.  Vies she follow-up late May early June at the earliest with provider

## 2021-11-26 NOTE — Telephone Encounter (Signed)
LM for pt to return call to discuss.  

## 2021-11-27 NOTE — Telephone Encounter (Signed)
Spoke with patient regarding results/recommendations.  

## 2021-11-27 NOTE — Telephone Encounter (Signed)
Pt will return call to discuss

## 2021-11-30 ENCOUNTER — Other Ambulatory Visit (HOSPITAL_COMMUNITY): Payer: Self-pay

## 2021-12-14 ENCOUNTER — Other Ambulatory Visit (HOSPITAL_COMMUNITY): Payer: Self-pay

## 2021-12-15 ENCOUNTER — Other Ambulatory Visit (HOSPITAL_COMMUNITY): Payer: Self-pay

## 2021-12-15 ENCOUNTER — Telehealth: Payer: Self-pay | Admitting: Pharmacy Technician

## 2021-12-15 NOTE — Telephone Encounter (Signed)
ERROR

## 2021-12-18 ENCOUNTER — Other Ambulatory Visit (HOSPITAL_COMMUNITY): Payer: Self-pay

## 2021-12-21 ENCOUNTER — Other Ambulatory Visit (HOSPITAL_COMMUNITY): Payer: Self-pay

## 2021-12-22 DIAGNOSIS — X32XXXS Exposure to sunlight, sequela: Secondary | ICD-10-CM | POA: Diagnosis not present

## 2021-12-22 DIAGNOSIS — L821 Other seborrheic keratosis: Secondary | ICD-10-CM | POA: Diagnosis not present

## 2021-12-22 DIAGNOSIS — L814 Other melanin hyperpigmentation: Secondary | ICD-10-CM | POA: Diagnosis not present

## 2021-12-22 DIAGNOSIS — L570 Actinic keratosis: Secondary | ICD-10-CM | POA: Diagnosis not present

## 2021-12-22 DIAGNOSIS — L820 Inflamed seborrheic keratosis: Secondary | ICD-10-CM | POA: Diagnosis not present

## 2021-12-22 DIAGNOSIS — D485 Neoplasm of uncertain behavior of skin: Secondary | ICD-10-CM | POA: Diagnosis not present

## 2021-12-22 DIAGNOSIS — Z129 Encounter for screening for malignant neoplasm, site unspecified: Secondary | ICD-10-CM | POA: Diagnosis not present

## 2021-12-28 ENCOUNTER — Other Ambulatory Visit (HOSPITAL_COMMUNITY): Payer: Self-pay

## 2021-12-29 ENCOUNTER — Other Ambulatory Visit (HOSPITAL_COMMUNITY): Payer: Self-pay

## 2021-12-30 ENCOUNTER — Other Ambulatory Visit (HOSPITAL_COMMUNITY): Payer: Self-pay

## 2021-12-30 DIAGNOSIS — M810 Age-related osteoporosis without current pathological fracture: Secondary | ICD-10-CM | POA: Diagnosis not present

## 2022-01-08 ENCOUNTER — Other Ambulatory Visit (HOSPITAL_COMMUNITY): Payer: Self-pay

## 2022-01-12 ENCOUNTER — Other Ambulatory Visit (HOSPITAL_COMMUNITY): Payer: Self-pay

## 2022-03-05 ENCOUNTER — Other Ambulatory Visit (HOSPITAL_COMMUNITY): Payer: Self-pay

## 2022-03-05 ENCOUNTER — Encounter: Payer: Self-pay | Admitting: Family Medicine

## 2022-03-05 ENCOUNTER — Ambulatory Visit (INDEPENDENT_AMBULATORY_CARE_PROVIDER_SITE_OTHER): Payer: Medicare Other | Admitting: Family Medicine

## 2022-03-05 VITALS — BP 119/75 | HR 75 | Temp 97.5°F | Ht 66.75 in | Wt 135.0 lb

## 2022-03-05 DIAGNOSIS — M159 Polyosteoarthritis, unspecified: Secondary | ICD-10-CM | POA: Insufficient documentation

## 2022-03-05 DIAGNOSIS — E785 Hyperlipidemia, unspecified: Secondary | ICD-10-CM

## 2022-03-05 DIAGNOSIS — M810 Age-related osteoporosis without current pathological fracture: Secondary | ICD-10-CM | POA: Diagnosis not present

## 2022-03-05 DIAGNOSIS — W57XXXD Bitten or stung by nonvenomous insect and other nonvenomous arthropods, subsequent encounter: Secondary | ICD-10-CM | POA: Diagnosis not present

## 2022-03-05 DIAGNOSIS — L989 Disorder of the skin and subcutaneous tissue, unspecified: Secondary | ICD-10-CM | POA: Diagnosis not present

## 2022-03-05 DIAGNOSIS — W57XXXA Bitten or stung by nonvenomous insect and other nonvenomous arthropods, initial encounter: Secondary | ICD-10-CM

## 2022-03-05 DIAGNOSIS — E559 Vitamin D deficiency, unspecified: Secondary | ICD-10-CM

## 2022-03-05 LAB — LIPID PANEL
Cholesterol: 234 mg/dL — ABNORMAL HIGH (ref 0–200)
HDL: 62 mg/dL (ref >39.00)
LDL Cholesterol: 159 mg/dL — ABNORMAL HIGH (ref 0–99)
NonHDL: 171.91
Total CHOL/HDL Ratio: 4
Triglycerides: 66 mg/dL (ref 0.0–149.0)
VLDL: 13.2 mg/dL (ref 0.0–40.0)

## 2022-03-05 MED ORDER — DICLOFENAC SODIUM 75 MG PO TBEC
75.0000 mg | DELAYED_RELEASE_TABLET | Freq: Two times a day (BID) | ORAL | 5 refills | Status: DC
  Filled 2022-03-05: qty 60, 30d supply, fill #0
  Filled 2022-04-12: qty 60, 30d supply, fill #1
  Filled 2022-05-31: qty 60, 30d supply, fill #2
  Filled 2022-07-22 – 2022-08-18 (×2): qty 60, 30d supply, fill #3
  Filled 2022-09-10: qty 60, 30d supply, fill #4

## 2022-03-05 NOTE — Progress Notes (Signed)
Robin Henry , November 05, 1955, 67 y.o., female MRN: [id number] Patient Care Team    Relationship Specialty Notifications Start End  Ma Hillock, DO PCP - General Family Medicine  10/29/16   Jerrell Belfast, MD Consulting Physician Otolaryngology  11/15/16   Clarene Essex, MD Consulting Physician Gastroenterology  04/25/18   Denton Meek  Gynecology  10/20/21   Macarthur Critchley, Kelliher Referring Physician Optometry  10/20/21   Hollar, Katharine Look, MD Referring Physician Dermatology  10/20/21   Sherlynn Stalls, MD Consulting Physician Ophthalmology  10/20/21     Chief Complaint  Patient presents with   Hyperlipidemia    F/u; pt is fasting     Subjective: Pt presents for an OV hyperlipidemia follow up.  HLD: Pt was seen for her physical approximately 6 months ago and was found to have elevated LDL above goal.  She declined starting prescribed medication.  She consider starting red yeast rice, but then settled bergamot capsules.  She is here today to follow-up on her cholesterol.  She reports she did change her diet some.  She pulled back on the sugars, fatty meats and butters.  Vitamin D deficiency/Osteoporosis without current pathological fracture, unspecified osteoporosis type DEXA -1.7 and vitamin D in normal range up-to-date 2023 follow-up every 2 years This is an improvement and she is taking the Prolia injections.  Primary osteoarthritis involving multiple joints Patient reports her arthritis pain has been worsening.  She forgot that she was prescribed diclofenac and has not started this as of yet.  Tick bite, unspecified site, initial encounter Patient reports she noticed a tick present a few weeks ago.  She denies any symptoms other than her arthritis seems to be flaring.  She denies any fevers, chills, joint swelling, rash or headache.  She would like to be tested for Lyme titers today.  Skin lesion: Patient reports she has a skin lesion on her hand that is bothering her.  She has  concerns it could be cancerous.  She does have a dermatologist but did not mention it to them.  She denies any trauma to the area.  But does endorse she gardens routinely.  Lipid Panel     Component Value Date/Time   CHOL 219 (H) 10/20/2021 1351   TRIG 114 10/20/2021 1351   TRIG 37 09/08/2006 0945   HDL 55 10/20/2021 1351   CHOLHDL 4.0 10/20/2021 1351   VLDL 11 01/16/2020 0850   LDLCALC 141 (H) 10/20/2021 1351   LDLDIRECT 125.8 09/12/2013 0934       10/20/2021    1:20 PM 03/10/2021   10:38 AM 02/06/2019    8:22 AM 02/20/2018    3:13 PM  Depression screen PHQ 2/9  Decreased Interest 0 0 0 0  Down, Depressed, Hopeless 0 0 0 0  PHQ - 2 Score 0 0 0 0    No Known Allergies Social History   Social History Narrative   ** Merged History Encounter **       Widow, no children. 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  [place] resident since 1989. 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 Exercises >3 X/week.    Past Medical History:  Diagnosis Date   Chronic paronychia of finger of left hand 10/10/2018   Foot cramps 02/06/2019   Hoarseness 2018   with cough; laryngoscopy by Dr. Wilburn Cornelia showed all normal except postglottic erythema c/w reflux.  Pt started on PPI q 5 PM.  If cough persists, consider referral to [facility].  Laryngopharyngeal reflux (LPR) 11/2016   Macular dystrophy    Hx of detached retina (Dr. Baird Cancer)   Osteoarthritis, multiple sites    Primarily knees   Tinea corporis 02/06/2019   Past Surgical History:  Procedure Laterality Date   ABDOMINAL HYSTERECTOMY  2001   fibroids   COLONOSCOPY  09/08/2012   Normal.  Recall 10 yrs.  Procedure: COLONOSCOPY;  Surgeon: Jeryl Columbia, MD;  Location: [facility];  Service: Endoscopy;  Laterality: N/A;  wants no sedation-but start iv   UPPER GASTROINTESTINAL ENDOSCOPY  04/17/2018   neg H.Pylori, terminal ileum eroded chronic active ileitis negative for dysplasia, CMV neg.    Family History  Problem Relation Age of Onset   Hypertension  Mother    Emphysema Mother    Cancer Mother        cartoid   Alcohol abuse Sister    Alcohol abuse Maternal Grandmother    Lung cancer Maternal Grandmother    Colon cancer Paternal Grandfather    Allergies as of 03/05/2022   No Known Allergies      Medication List        Accurate as of March 05, 2022  8:49 AM. If you have any questions, ask your nurse or doctor.          STOP taking these medications    amoxicillin-clavulanate 875-125 MG tablet Commonly known as: AUGMENTIN Stopped by: Howard Pouch, DO   chlorhexidine 0.12 % solution Commonly known as: Peridex Stopped by: Howard Pouch, DO       TAKE these medications    ALIGN PO Take by mouth.   Bergamot Oil Oil 500 mg by Does not apply route.   CALCIUM 1200 PO Take by mouth 2 (two) times daily.   diclofenac 75 MG EC tablet Commonly known as: VOLTAREN Take 1 tablet (75 mg total) by mouth 2 (two) times daily.   doxycycline 100 MG capsule Commonly known as: VIBRAMYCIN TAKE 1 CAPSULE BY MOUTH ONCE A DAY WITH A MEAL.   fish oil-omega-3 fatty acids 1000 MG capsule Take 2 g by mouth daily.   Prolia 60 MG/ML Sosy injection Generic drug: denosumab Inject 1 mL by subcutaneous route as directed.   Red Yeast Rice 600 MG Caps Take 2 capsules (1,200 mg total) by mouth at bedtime.   TURMERIC PO Take 1 capsule by mouth 2 (two) times daily.   PreserVision AREDS 2 Caps Take 1 softgel in the morning and 1 in the evening with food   VISION-VITE PRESERVE PO Take by mouth daily.   VITAMIN D (CHOLECALCIFEROL) PO Take 5,000 mg by mouth 1 day or 1 dose.        All past medical history, surgical history, allergies, family history, immunizations andmedications were updated in the EMR today and reviewed under the history and medication portions of their EMR.     ROS Negative, with the exception of above mentioned in HPI   Objective:  BP 119/75   Pulse 75   Temp (!) 97.5 F (36.4 C) (Oral)   Ht 5' 6.75"  (1.695 m)   Wt 135 lb (61.2 kg)   SpO2 96%   BMI 21.30 kg/m  Body mass index is 21.3 kg/m. Physical Exam Vitals and nursing note reviewed.  Constitutional:      General: She is not in acute distress.    Appearance: Normal appearance. She is not ill-appearing, toxic-appearing or diaphoretic.  HENT:     Head: Normocephalic and atraumatic.  Eyes:  General: No scleral icterus.       Right eye: No discharge.        Left eye: No discharge.     Extraocular Movements: Extraocular movements intact.     Conjunctiva/sclera: Conjunctivae normal.     Pupils: Pupils are equal, round, and reactive to light.  Cardiovascular:     Rate and Rhythm: Normal rate and regular rhythm.  Pulmonary:     Effort: Pulmonary effort is normal. No respiratory distress.     Breath sounds: Normal breath sounds. No wheezing, rhonchi or rales.  Musculoskeletal:     Cervical back: Neck supple. No tenderness.     Right lower leg: No edema.     Left lower leg: No edema.  Lymphadenopathy:     Cervical: No cervical adenopathy.  Skin:    General: Skin is warm and dry.     Coloration: Skin is not jaundiced or pale.     Findings: Lesion present. No erythema or rash.     Comments: ~65m raised scaly lesion with mild erythremic base right index MCP joint.   Neurological:     Mental Status: She is alert and oriented to person, place, and time. Mental status is at baseline.     Motor: No weakness.     Gait: Gait normal.  Psychiatric:        Mood and Affect: Mood normal.        Behavior: Behavior normal.        Thought Content: Thought content normal.        Judgment: Judgment normal.    No results found. No results found. No results found for this or any previous visit (from the past 24 hour(s)).  Assessment/Plan: JMARLEI GLOMSKIis a 66y.o. female present for OV for  Vitamin D deficiency/Osteoporosis without current pathological fracture, unspecified osteoporosis type Dexa UTD 11/24/2021 (-1.7) [facility] Vit d   37 utd 10/2021  Hyperlipidemia LDL goal <130 Patient has been trying dietary changes and bergamot capsules.  We will test her lipids today.  If still above goal would then consider adding statin. - Lipid panel Primary osteoarthritis involving multiple joints She has not tried the diclofenac as of yet.  Encouraged her to start the diclofenac twice daily with meals.  Tick bite, unspecified site, initial encounter Agreed to test for Lyme titers, if needed can then prescribe treatment. - B. burgdorfi antibodies  Skin lesion:  Can lesion of her hand could be a precancer versus squamous, but appears to maybe be trauma/foreign body present.  Discussed options with her today and if she would like she can schedule a procedure appointment to have a punch biopsy and send to path for evaluation.  She is concerned this could be skin cancer.   Reviewed expectations re: course of current medical issues. Discussed self-management of symptoms. Outlined signs and symptoms indicating need for more acute intervention. Patient verbalized understanding and all questions were answered. Patient received an After-Visit Summary.    Orders Placed This Encounter  Procedures   Lipid panel   No orders of the defined types were placed in this encounter.  Referral Orders  No referral(s) requested today     Note is dictated utilizing voice recognition software. Although note has been proof read prior to signing, occasional typographical errors still can be missed. If any questions arise, please do not hesitate to call for verification.   electronically signed by:  RHoward Pouch DO  [place]

## 2022-03-05 NOTE — Patient Instructions (Signed)
No follow-ups on file.        Great to see you today.    If labs were collected, we will inform you of lab results once received either by echart message or telephone call.   - echart message- for normal results that have been seen by the patient already.   - telephone call: abnormal results or if patient has not viewed results in their echart.   Preventing High Cholesterol Cholesterol is a white, waxy substance similar to fat that the human body needs to help build cells. The liver makes all the cholesterol that a person's body needs. Having high cholesterol (hypercholesterolemia) increases your risk for heart disease and stroke. Extra or excess cholesterol comes from the food that you eat. High cholesterol can often be prevented with diet and lifestyle changes. If you already have high cholesterol, you can control it with diet, lifestyle changes, and medicines. How can high cholesterol affect me? If you have high cholesterol, fatty deposits (plaques) may build up on the walls of your blood vessels. The blood vessels that carry blood away from your heart are called arteries. Plaques make the arteries narrower and stiffer. This in turn can: Restrict or block blood flow and cause blood clots to form. Increase your risk for heart attack and stroke. What can increase my risk for high cholesterol? This condition is more likely to develop in people who: Eat foods that are high in saturated fat or cholesterol. Saturated fat is mostly found in foods that come from animal sources. Are overweight. Are not getting enough exercise. Use products that contain nicotine or tobacco, such as cigarettes, e-cigarettes, and chewing tobacco. Have a family history of high cholesterol (familial hypercholesterolemia). What actions can I take to prevent this? Nutrition  Eat less saturated fat. Avoid trans fats (partially hydrogenated oils). These are often found in margarine and in some baked goods, fried  foods, and snacks bought in packages. Avoid precooked or cured meat, such as bacon, sausages, or meat loaves. Avoid foods and drinks that have added sugars. Eat more fruits, vegetables, and whole grains. Choose healthy sources of protein, such as fish, poultry, lean cuts of red meat, beans, peas, lentils, and nuts. Choose healthy sources of fat, such as: Nuts. Vegetable oils, especially olive oil. Fish that have healthy fats, such as omega-3 fatty acids. These fish include mackerel or salmon. Lifestyle Lose weight if you are overweight. Maintaining a healthy body mass index (BMI) can help prevent or control high cholesterol. It can also lower your risk for diabetes and high blood pressure. Ask your health care provider to help you with a diet and exercise plan to lose weight safely. Do not use any products that contain nicotine or tobacco. These products include cigarettes, chewing tobacco, and vaping devices, such as e-cigarettes. If you need help quitting, ask your health care provider. Alcohol use Do not drink alcohol if: Your health care provider tells you not to drink. You are pregnant, may be pregnant, or are planning to become pregnant. If you drink alcohol: Limit how much you have to: 0-1 drink a day for women. 0-2 drinks a day for men. Know how much alcohol is in your drink. In the [place] one drink equals one 12 oz bottle of beer (355 mL), one 5 oz glass of wine (148 mL), or one 1 oz glass of hard liquor (44 mL). Activity  Get enough exercise. Do exercises as told by your health care provider. Each week, do at least 150  minutes of exercise that takes a medium level of effort (moderate-intensity exercise). This kind of exercise: Makes your heart beat faster while allowing you to still be able to talk. Can be done in short sessions several times a day or longer sessions a few times a week. For example, on 5 days each week, you could walk fast or ride your bike 3 times a day for 10  minutes each time. Medicines Your health care provider may recommend medicines to help lower cholesterol. This may be a medicine to lower the amount of cholesterol that your liver makes. You may need medicine if: Diet and lifestyle changes have not lowered your cholesterol enough. You have high cholesterol and other risk factors for heart disease or stroke. Take over-the-counter and prescription medicines only as told by your health care provider. General information Manage your risk factors for high cholesterol. Talk with your health care provider about all your risk factors and how to lower your risk. Manage other conditions that you have, such as diabetes or high blood pressure (hypertension). Have blood tests to check your cholesterol levels at regular points in time as told by your health care provider. Keep all follow-up visits. This is important. Where to find more information American Heart Association: www.heart.org National Heart, Lung, and Blood Institute: https://wilson-eaton.com/ Summary High cholesterol increases your risk for heart disease and stroke. By keeping your cholesterol level low, you can reduce your risk for these conditions. High cholesterol can often be prevented with diet and lifestyle changes. Work with your health care provider to manage your risk factors, and have your blood tested regularly. This information is not intended to replace advice given to you by your health care provider. Make sure you discuss any questions you have with your health care provider. Document Revised: 11/24/2020 Document Reviewed: 11/24/2020 Elsevier Patient Education  [place].

## 2022-03-07 LAB — B. BURGDORFI ANTIBODIES: B burgdorferi Ab IgG+IgM: <0.9 index

## 2022-03-08 ENCOUNTER — Telehealth: Payer: Self-pay | Admitting: Family Medicine

## 2022-03-08 ENCOUNTER — Other Ambulatory Visit (HOSPITAL_COMMUNITY): Payer: Self-pay

## 2022-03-08 MED ORDER — ATORVASTATIN CALCIUM 10 MG PO TABS
10.0000 mg | ORAL_TABLET | Freq: Every day | ORAL | 3 refills | Status: DC
Start: 2022-03-08 — End: 2022-10-21
  Filled 2022-03-08: qty 90, 90d supply, fill #0
  Filled 2022-05-31: qty 90, 90d supply, fill #1
  Filled 2022-08-18: qty 90, 90d supply, fill #2

## 2022-03-08 NOTE — Telephone Encounter (Signed)
Please inform patient Robin Henry Lyme titers are negative. Robin Henry cholesterol is higher than last time now with an LDL/bad cholesterol 159.  I called in low-dose Lipitor for Robin Henry to start 1 tab before bed.

## 2022-03-08 NOTE — Telephone Encounter (Signed)
Spoke with pt regarding labs and instructions.   

## 2022-03-09 ENCOUNTER — Other Ambulatory Visit (HOSPITAL_COMMUNITY): Payer: Self-pay

## 2022-03-09 DIAGNOSIS — L989 Disorder of the skin and subcutaneous tissue, unspecified: Secondary | ICD-10-CM | POA: Insufficient documentation

## 2022-03-09 HISTORY — DX: Disorder of the skin and subcutaneous tissue, unspecified: L98.9

## 2022-03-10 ENCOUNTER — Other Ambulatory Visit (HOSPITAL_COMMUNITY)
Admission: RE | Admit: 2022-03-10 | Discharge: 2022-03-10 | Disposition: A | Discharge status: Home / Self Care | Payer: Medicare Other | Source: Ambulatory Visit | Attending: Family Medicine | Admitting: Family Medicine

## 2022-03-10 ENCOUNTER — Encounter: Payer: Self-pay | Admitting: Family Medicine

## 2022-03-10 ENCOUNTER — Ambulatory Visit (INDEPENDENT_AMBULATORY_CARE_PROVIDER_SITE_OTHER): Payer: 59 | Admitting: Family Medicine

## 2022-03-10 VITALS — BP 99/65 | HR 72 | Temp 97.4°F | Ht 66.75 in | Wt 135.0 lb

## 2022-03-10 DIAGNOSIS — L989 Disorder of the skin and subcutaneous tissue, unspecified: Secondary | ICD-10-CM

## 2022-03-10 DIAGNOSIS — C44622 Squamous cell carcinoma of skin of right upper limb, including shoulder: Secondary | ICD-10-CM

## 2022-03-10 DIAGNOSIS — D489 Neoplasm of uncertain behavior, unspecified: Secondary | ICD-10-CM

## 2022-03-10 NOTE — Progress Notes (Signed)
Robin Henry , 1956/07/09, 66 y.o., female MRN: [id number] Patient Care Team    Relationship Specialty Notifications Start End  Ma Hillock, DO PCP - General Family Medicine  10/29/16   Jerrell Belfast, MD Consulting Physician Otolaryngology  11/15/16   Clarene Essex, MD Consulting Physician Gastroenterology  04/25/18   Denton Meek  Gynecology  10/20/21   Macarthur Critchley, [place] Referring Physician Optometry  10/20/21   Hollar, Katharine Look, MD Referring Physician Dermatology  10/20/21   Sherlynn Stalls, MD Consulting Physician Ophthalmology  10/20/21     Chief Complaint  Patient presents with   Procedure    Skin bx; R hand; consent signed      Subjective: Robin Henry is a 66 y.o. female Pt presents for an OV to have nonhealing skin lesion removed from her right hand. She reports skin lesion is new over the last few months. It continues to be scaly and will not heal. No fhx of skin cancer. She has had AK and SK removed in the past by dermatology.      10/20/2021    1:20 PM 03/10/2021   10:38 AM 02/06/2019    8:22 AM 02/20/2018    3:13 PM  Depression screen PHQ 2/9  Decreased Interest 0 0 0 0  Down, Depressed, Hopeless 0 0 0 0  PHQ - 2 Score 0 0 0 0    No Known Allergies Social History   Social History Narrative   ** Merged History Encounter **       Widow, no children. 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  [place] resident since 1989. 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 Exercises >3 X/week.    Past Medical History:  Diagnosis Date   Chronic paronychia of finger of left hand 10/10/2018   Foot cramps 02/06/2019   Hoarseness 2018   with cough; laryngoscopy by Dr. Wilburn Cornelia showed all normal except postglottic erythema c/w reflux.  Pt started on PPI q 5 PM.  If cough persists, consider referral to [facility].   Laryngopharyngeal reflux (LPR) 11/2016   Macular dystrophy    Hx of detached retina (Dr. Baird Cancer)   Osteoarthritis, multiple sites    Primarily knees   Tinea corporis 02/06/2019    Past Surgical History:  Procedure Laterality Date   ABDOMINAL HYSTERECTOMY  2001   fibroids   COLONOSCOPY  09/08/2012   Normal.  Recall 10 yrs.  Procedure: COLONOSCOPY;  Surgeon: Jeryl Columbia, MD;  Location: [facility];  Service: Endoscopy;  Laterality: N/A;  wants no sedation-but start iv   UPPER GASTROINTESTINAL ENDOSCOPY  04/17/2018   neg H.Pylori, terminal ileum eroded chronic active ileitis negative for dysplasia, CMV neg.    Family History  Problem Relation Age of Onset   Hypertension Mother    Emphysema Mother    Cancer Mother        cartoid   Alcohol abuse Sister    Alcohol abuse Maternal Grandmother    Lung cancer Maternal Grandmother    Colon cancer Paternal Grandfather    Allergies as of 03/10/2022   No Known Allergies      Medication List        Accurate as of March 10, 2022  4:12 PM. If you have any questions, ask your nurse or doctor.          ALIGN PO Take by mouth.   atorvastatin 10 MG tablet Commonly known as: LIPITOR Take 1 tablet by mouth daily.   Bergamot Oil Oil 500  mg by Does not apply route.   CALCIUM 1200 PO Take by mouth 2 (two) times daily.   diclofenac 75 MG EC tablet Commonly known as: VOLTAREN Take 1 tablet by mouth 2 times daily.   fish oil-omega-3 fatty acids 1000 MG capsule Take 2 g by mouth daily.   Prolia 60 MG/ML Sosy injection Generic drug: denosumab Inject 1 mL by subcutaneous route as directed.   PreserVision AREDS 2 Caps Take 1 softgel in the morning and 1 in the evening with food   VISION-VITE PRESERVE PO Take by mouth daily.   VITAMIN D (CHOLECALCIFEROL) PO Take 5,000 mg by mouth 1 day or 1 dose.        All past medical history, surgical history, allergies, family history, immunizations andmedications were updated in the EMR today and reviewed under the history and medication portions of their EMR.     ROS Negative, with the exception of above mentioned in HPI   Objective:  BP 99/65   Pulse 72    Temp (!) 97.4 F (36.3 C) (Oral)   Ht 5' 6.75" (1.695 m)   Wt 135 lb (61.2 kg)   SpO2 95%   BMI 21.30 kg/m  Body mass index is 21.3 kg/m. Physical Exam Vitals and nursing note reviewed.  Constitutional:      General: She is not in acute distress.    Appearance: Normal appearance. She is normal weight. She is not ill-appearing or toxic-appearing.  Eyes:     Extraocular Movements: Extraocular movements intact.     Conjunctiva/sclera: Conjunctivae normal.     Pupils: Pupils are equal, round, and reactive to light.  Skin:    Findings: Lesion present.     Comments: Right index MCP joint with 3.5 mm scaly, rough, mildly raised lesion with mild erythema at base  Neurological:     Mental Status: She is alert and oriented to person, place, and time. Mental status is at baseline.  Psychiatric:        Mood and Affect: Mood normal.        Behavior: Behavior normal.        Thought Content: Thought content normal.        Judgment: Judgment normal.    No results found. No results found. No results found for this or any previous visit (from the past 24 hour(s)).  Assessment/Plan: Robin Henry is a 66 y.o. female present for OV for  Skin lesion of hand/Neoplasm of uncertain behavior - Surgical pathology( [place]) The biopsy was performed on the right hand. The patient was consented for biopsy. The complications, instructions as to how the procedure will be performed, and postoperative instructions were given to the patient.  Intent: To remove a portion of skin so that it can be examined histologically. Location: Right hand/index MCP joint PROCEDURE: The site was cleaned with antiseptic.  0.5 cc of  local anesthetic (1% lidocaine with epi) injected surrounding site. A 4 mm mm punch biopsy was performed on right index MCP/hand  with careful attention to obtain both affected tissue and normal tissue.  The site was then checked for bleeding. Once hemostasis was achieved punch  biopsy was closed with 4-0 Vicryl x1 subcu and Steri-Strips.   pressure dressing applied. The patient was further instructed to keep the site completely dry for the next 24 hours, after which a new dressing can be applied.. They were further instructed to avoid getting the site dirty, or immersing and water.  Patient was instructed  to allow Steri-Strips to fall off naturally without pulling.  The patient completed the procedure without any complications and tolerated the the procedure well.  The biopsy will  be sent for analysis.   Reviewed expectations re: course of current medical issues. Discussed self-management of symptoms. Outlined signs and symptoms indicating need for more acute intervention. Patient verbalized understanding and all questions were answered. Patient received an After-Visit Summary.    No orders of the defined types were placed in this encounter.  No orders of the defined types were placed in this encounter.  Referral Orders  No referral(s) requested today     Note is dictated utilizing voice recognition software. Although note has been proof read prior to signing, occasional typographical errors still can be missed. If any questions arise, please do not hesitate to call for verification.   electronically signed by:  Howard Pouch, DO  [place]

## 2022-03-12 ENCOUNTER — Telehealth: Payer: Self-pay | Admitting: Family Medicine

## 2022-03-12 DIAGNOSIS — C44621 Squamous cell carcinoma of skin of unspecified upper limb, including shoulder: Secondary | ICD-10-CM

## 2022-03-12 LAB — SURGICAL PATHOLOGY

## 2022-03-12 NOTE — Telephone Encounter (Signed)
Attempted to call pt to discuss her derm path result. - no answer   Path result is a squamous cell cancer. These type of cancers need additional excisions to ensure all margins are clear and then closure.  We will need to refer her to dermatologist to perform the additional excision and closure.   I have placed a referral to the skin surgery center off of [address].They perform many of these types of excisions and closures.    Staff: Please send copy of derm path report and last OV note to them.  thanks

## 2022-03-15 NOTE — Telephone Encounter (Signed)
Spoke with pt regarding labs and instructions.   

## 2022-04-12 ENCOUNTER — Ambulatory Visit: Payer: 59 | Admitting: Family Medicine

## 2022-04-12 ENCOUNTER — Other Ambulatory Visit (HOSPITAL_COMMUNITY): Payer: Self-pay

## 2022-04-12 MED ORDER — FLUCONAZOLE 150 MG PO TABS
ORAL_TABLET | ORAL | 1 refills | Status: DC
  Filled 2022-04-12: qty 2, 3d supply, fill #0

## 2022-04-12 MED ORDER — AMOXICILLIN-POT CLAVULANATE 875-125 MG PO TABS
ORAL_TABLET | ORAL | 1 refills | Status: DC
  Filled 2022-04-12: qty 20, 10d supply, fill #0
  Filled 2022-04-26: qty 20, 10d supply, fill #1

## 2022-04-26 ENCOUNTER — Other Ambulatory Visit (HOSPITAL_COMMUNITY): Payer: Self-pay

## 2022-05-13 DIAGNOSIS — C44622 Squamous cell carcinoma of skin of right upper limb, including shoulder: Secondary | ICD-10-CM | POA: Diagnosis not present

## 2022-05-31 ENCOUNTER — Other Ambulatory Visit (HOSPITAL_COMMUNITY): Payer: Self-pay

## 2022-06-01 DIAGNOSIS — L905 Scar conditions and fibrosis of skin: Secondary | ICD-10-CM | POA: Diagnosis not present

## 2022-06-04 ENCOUNTER — Other Ambulatory Visit (HOSPITAL_COMMUNITY): Payer: Self-pay

## 2022-06-08 ENCOUNTER — Other Ambulatory Visit (HOSPITAL_COMMUNITY): Payer: Self-pay

## 2022-06-10 ENCOUNTER — Other Ambulatory Visit (HOSPITAL_COMMUNITY): Payer: Self-pay

## 2022-06-15 ENCOUNTER — Telehealth: Payer: Self-pay | Admitting: Family Medicine

## 2022-06-15 NOTE — Telephone Encounter (Signed)
LM to schedule medicare well visit.

## 2022-06-17 NOTE — Telephone Encounter (Signed)
Called pt to schedule AWV. Please schedule with PCP for welcome to medicare.

## 2022-06-18 ENCOUNTER — Other Ambulatory Visit (HOSPITAL_COMMUNITY): Payer: Self-pay

## 2022-06-18 ENCOUNTER — Other Ambulatory Visit: Payer: Self-pay | Admitting: Family Medicine

## 2022-06-18 DIAGNOSIS — Z1231 Encounter for screening mammogram for malignant neoplasm of breast: Secondary | ICD-10-CM

## 2022-06-21 NOTE — Progress Notes (Unsigned)
Subjective:   Robin Henry is a 66 y.o. female who presents for an Initial Medicare Annual Wellness Visit.  Review of Systems    ***       Objective:    There were no vitals filed for this visit. There is no height or weight on file to calculate BMI.      No data to display           Current Medications (verified) Outpatient Encounter Medications as of 06/22/2022  Medication Sig   amoxicillin-clavulanate (AUGMENTIN) 875-125 MG tablet TAKE 1 TABLET TWICE DAILY UNTIL GONE.   atorvastatin (LIPITOR) 10 MG tablet Take 1 tablet by mouth daily.   Bergamot Oil OIL 500 mg by Does not apply route. (Patient not taking: Reported on 03/10/2022)   Calcium Carbonate-Vit D-Min (CALCIUM 1200 PO) Take by mouth 2 (two) times daily.   denosumab (PROLIA) 60 MG/ML SOSY injection Inject 1 mL by subcutaneous route as directed.   diclofenac (VOLTAREN) 75 MG EC tablet Take 1 tablet by mouth 2 times daily.   fish oil-omega-3 fatty acids 1000 MG capsule Take 2 g by mouth daily.   fluconazole (DIFLUCAN) 150 MG tablet TAKE 1 TABLET BY MOUTH ONCE. MAY REPEAT IN 2 DAYS.Marland Kitchen   Multiple Vitamins-Minerals (PRESERVISION AREDS 2) CAPS Take 1 softgel in the morning and 1 in the evening with food   Multiple Vitamins-Minerals (VISION-VITE PRESERVE PO) Take by mouth daily.   Probiotic Product (ALIGN PO) Take by mouth.   VITAMIN D, CHOLECALCIFEROL, PO Take 5,000 mg by mouth 1 day or 1 dose.    No facility-administered encounter medications on file as of 06/22/2022.    Allergies (verified) Patient has no known allergies.   History: Past Medical History:  Diagnosis Date   Chronic paronychia of finger of left hand 10/10/2018   Foot cramps 02/06/2019   Hoarseness 2018   with cough; laryngoscopy by Dr. Wilburn Cornelia showed all normal except postglottic erythema c/w reflux.  Pt started on PPI q 5 PM.  If cough persists, consider referral to [facility].   Laryngopharyngeal reflux (LPR) 11/2016   Macular dystrophy     Hx of detached retina (Dr. Baird Cancer)   Osteoarthritis, multiple sites    Primarily knees   Tinea corporis 02/06/2019   Past Surgical History:  Procedure Laterality Date   ABDOMINAL HYSTERECTOMY  2001   fibroids   COLONOSCOPY  09/08/2012   Normal.  Recall 10 yrs.  Procedure: COLONOSCOPY;  Surgeon: Jeryl Columbia, MD;  Location: [facility];  Service: Endoscopy;  Laterality: N/A;  wants no sedation-but start iv   UPPER GASTROINTESTINAL ENDOSCOPY  04/17/2018   neg H.Pylori, terminal ileum eroded chronic active ileitis negative for dysplasia, CMV neg.    Family History  Problem Relation Age of Onset   Hypertension Mother    Emphysema Mother    Cancer Mother        cartoid   Alcohol abuse Sister    Alcohol abuse Maternal Grandmother    Lung cancer Maternal Grandmother    Colon cancer Paternal Grandfather    Social History   Socioeconomic History   Marital status: Widowed    Spouse name: Not on file   Number of children: Not on file   Years of education: Not on file   Highest education level: Not on file  Occupational History   Not on file  Tobacco Use   Smoking status: Never    Passive exposure: Never   Smokeless tobacco: Never  Vaping Use   Vaping Use: Never used  Substance and Sexual Activity   Alcohol use: Yes    Comment: rare   Drug use: No   Sexual activity: Yes  Other Topics Concern   Not on file  Social History Narrative   ** Merged History Encounter **       Widow, no children. 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    Social Determinants of Health   Financial Resource Strain: Not on file  Food Insecurity: Not on file  Transportation Needs: Not on file  Physical Activity: Not on file  Stress: Not on file  Social Connections: Not on file    Tobacco Counseling Counseling given: Not Answered   Clinical Intake:                 Diabetic?***         Activities of  Daily Living    10/20/2021    1:12 PM  In your present state of health, do you have any difficulty performing the following activities:  Hearing? 0  Vision? 1  Difficulty concentrating or making decisions? 0  Walking or climbing stairs? 0  Dressing or bathing? 0  Doing errands, shopping? 0    Patient Care Team: Ma Hillock, DO as PCP - General (Family Medicine) Jerrell Belfast, MD as Consulting Physician (Otolaryngology) Clarene Essex, MD as Consulting Physician (Gastroenterology) Denton Meek (Gynecology) Macarthur Critchley, OD as Referring Physician (Optometry) Hollar, Katharine Look, MD as Referring Physician (Dermatology) Sherlynn Stalls, MD as Consulting Physician (Ophthalmology) [facility]  Indicate any recent Medical Services you may have received from other than [facility] providers in the past year (date may be approximate).     Assessment:   This is a routine wellness examination for Robin Henry.  Hearing/Vision screen No results found.  Dietary issues and exercise activities discussed:     Goals Addressed   None   Depression Screen    10/20/2021    1:20 PM 03/10/2021   10:38 AM 02/06/2019    8:22 AM 02/20/2018    3:13 PM  PHQ 2/9 Scores  PHQ - 2 Score 0 0 0 0    Fall Risk    10/20/2021    1:20 PM 03/10/2021   10:38 AM 02/20/2018    3:13 PM  Fall Risk   Falls in the past year? 0 0 No  Number falls in past yr:  0   Injury with Fall?  0   Risk for fall due to : No Fall Risks    Follow up Falls evaluation completed Falls evaluation completed     [place]:  Any stairs in or around the home? {YES/NO:21197} If so, are there any without handrails? {YES/NO:21197} Home free of loose throw rugs in walkways, pet beds, electrical cords, etc? {YES/NO:21197} Adequate lighting in your home to reduce risk of falls? {YES/NO:21197}  ASSISTIVE DEVICES UTILIZED TO PREVENT FALLS:  Life alert? {YES/NO:21197} Use of a cane, walker or w/c?  {YES/NO:21197} Grab bars in the bathroom? {YES/NO:21197} Shower chair or bench in shower? {YES/NO:21197} Elevated toilet seat or a handicapped toilet? {YES/NO:21197}  TIMED UP AND GO:  Was the test performed? {YES/NO:21197}.  Length of time to ambulate 10 feet: *** sec.   {Appearance of ZSWF:0932355}  Cognitive Function:        Immunizations Immunization History  Administered Date(s) Administered   Influenza-Unspecified 07/04/2017, 07/02/2021  PFIZER(Purple Top)SARS-COV-2 Vaccination 09/23/2019, 10/11/2019, 07/21/2020   PNEUMOCOCCAL CONJUGATE-20 10/20/2021   Pfizer Covid-19 Vaccine Bivalent Booster 44yr & up 09/10/2021   Tdap 04/25/2015   Zoster Recombinat (Shingrix) 02/21/2018   Zoster, Live 11/14/2015    {TDAP status:2101805}  {Flu Vaccine status:2101806}  {Pneumococcal vaccine status:2101807}  {Covid-19 vaccine status:2101808}  Qualifies for Shingles Vaccine? {YES/NO:21197}  Zostavax completed {YES/NO:21197}  {Shingrix Completed?:2101804}  Screening Tests Health Maintenance  Topic Date Due   Zoster Vaccines- Shingrix (2 of 2) 04/18/2018   COVID-19 Vaccine (5 - Pfizer risk series) 11/05/2021   INFLUENZA VACCINE  05/04/2022   MAMMOGRAM  06/10/2023   DEXA SCAN  11/25/2023   TETANUS/TDAP  04/24/2025   COLONOSCOPY (Pts 45-461yrInsurance coverage will need to be confirmed)  04/13/2028   Pneumonia Vaccine 6555Years old  Completed   Hepatitis C Screening  Completed   HPV VACCINES  Aged Out    Health Maintenance  Health Maintenance Due  Topic Date Due   Zoster Vaccines- Shingrix (2 of 2) 04/18/2018   COVID-19 Vaccine (5 - Pfizer risk series) 11/05/2021   INFLUENZA VACCINE  05/04/2022    Colorectal cancer screening: Type of screening: Colonoscopy. Completed 04/13/2018. Repeat every 10 years  Mammogram status: Completed 06/09/2021. Repeat every year  Bone Density status: Completed 11/24/21. Results reflect: Bone density results: OSTEOPENIA. Repeat every  2 years.  Lung Cancer Screening: (Low Dose CT Chest recommended if Age 67-80ears, 30 pack-year currently smoking OR have quit w/in 15years.) does not qualify.   Lung Cancer Screening Referral: n/a  Additional Screening:  Hepatitis C Screening: does qualify; Completed 02/20/2018  Vision Screening: Recommended annual ophthalmology exams for early detection of glaucoma and other disorders of the eye. Is the patient up to date with their annual eye exam?  {YES/NO:21197} Who is the provider or what is the name of the office in which the patient attends annual eye exams? *** If pt is not established with a provider, would they like to be referred to a provider to establish care? {YES/NO:21197}.   Dental Screening: Recommended annual dental exams for proper oral hygiene  Community Resource Referral / Chronic Care Management: CRR required this visit?  {YES/NO:21197}  CCM required this visit?  {YES/NO:21197}     Plan:     I have personally reviewed and noted the following in the patient's chart:   Medical and social history Use of alcohol, tobacco or illicit drugs  Current medications and supplements including opioid prescriptions. {Opioid [phone]} Functional ability and status Nutritional status Physical activity Advanced directives List of other physicians Hospitalizations, surgeries, and ER visits in previous 12 months Vitals Screenings to include cognitive, depression, and falls Referrals and appointments  In addition, I have reviewed and discussed with patient certain preventive protocols, quality metrics, and best practice recommendations. A written personalized care plan for preventive services as well as general preventive health recommendations were provided to patient.     VaBeatrix FettersCM[place] 06/21/2022   Nurse Notes: ***

## 2022-06-21 NOTE — Patient Instructions (Signed)

## 2022-06-22 ENCOUNTER — Ambulatory Visit (INDEPENDENT_AMBULATORY_CARE_PROVIDER_SITE_OTHER): Payer: Medicare Other

## 2022-06-22 DIAGNOSIS — H5213 Myopia, bilateral: Secondary | ICD-10-CM | POA: Diagnosis not present

## 2022-06-22 DIAGNOSIS — Z0000 Encounter for general adult medical examination without abnormal findings: Secondary | ICD-10-CM | POA: Diagnosis not present

## 2022-07-02 ENCOUNTER — Ambulatory Visit: Payer: Medicare Other

## 2022-07-02 ENCOUNTER — Other Ambulatory Visit (HOSPITAL_COMMUNITY): Payer: Self-pay

## 2022-07-02 MED ORDER — DENOSUMAB 60 MG/ML ~~LOC~~ SOSY
PREFILLED_SYRINGE | SUBCUTANEOUS | 2 refills | Status: DC
  Filled 2022-07-02: qty 1, 180d supply, fill #0

## 2022-07-05 ENCOUNTER — Other Ambulatory Visit (HOSPITAL_COMMUNITY): Payer: Self-pay

## 2022-07-07 ENCOUNTER — Ambulatory Visit (INDEPENDENT_AMBULATORY_CARE_PROVIDER_SITE_OTHER): Payer: Medicare Other

## 2022-07-07 DIAGNOSIS — Z23 Encounter for immunization: Secondary | ICD-10-CM

## 2022-07-21 ENCOUNTER — Encounter: Payer: Self-pay | Admitting: Family Medicine

## 2022-07-21 ENCOUNTER — Ambulatory Visit (INDEPENDENT_AMBULATORY_CARE_PROVIDER_SITE_OTHER): Payer: Medicare Other | Admitting: Family Medicine

## 2022-07-21 VITALS — BP 118/81 | HR 94 | Temp 98.0°F | Wt 139.2 lb

## 2022-07-21 DIAGNOSIS — M810 Age-related osteoporosis without current pathological fracture: Secondary | ICD-10-CM

## 2022-07-21 NOTE — Patient Instructions (Signed)
Osteoporosis  Osteoporosis is when the bones get thin and weak. This can cause your bones to break (fracture) more easily. What are the causes? The exact cause of this condition is not known. What increases the risk? Having family members with this condition. Not eating enough healthy foods. Taking certain medicines. Being female. Being age 67 or older. Smoking or using other products that contain nicotine or tobacco, such as e-cigarettes or chewing tobacco. Not exercising. Being of European or Asian ancestry. Having a small body frame. What are the signs or symptoms? A broken bone might be the first sign, especially if the break results from a fall or injury that usually would not cause a bone to break. Other signs and symptoms include: Pain in the neck or low back. Being hunched over (stooped posture). Getting shorter. How is this treated? Eating more foods with more calcium and vitamin D in them. Doing exercises. Stopping tobacco use. Limiting how much alcohol you drink. Taking medicines to slow bone loss or help make the bones stronger. Taking supplements of calcium and vitamin D every day. Taking medicines to replace chemicals in the body (hormone replacement medicines). Monitoring your levels of calcium and vitamin D. The goal of treatment is to strengthen your bones and lower your risk for a bone break. Follow these instructions at home: Eating and drinking Eat plenty of calcium and vitamin D. These nutrients are good for your bones. Good sources of calcium and vitamin D include: Some fish, such as salmon and tuna. Foods that have calcium and vitamin D added to them (fortified foods), such as some breakfast cereals. Egg yolks. Cheese. Liver.  Activity Do exercises as told by your doctor. Ask your doctor what exercises are safe for you. You should do: Exercises that make your muscles work to hold your body weight up (weight-bearing exercises). These include tai chi,  yoga, and walking. Exercises to make your muscles stronger. One example is lifting weights. Lifestyle Do not drink alcohol if: Your doctor tells you not to drink. You are pregnant, may be pregnant, or are planning to become pregnant. If you drink alcohol: Limit how much you use to: 0-1 drink a day for women. 0-2 drinks a day for men. Know how much alcohol is in your drink. In the [place] one drink equals one 12 oz bottle of beer (355 mL), one 5 oz glass of wine (148 mL), or one 1 oz glass of hard liquor (44 mL). Do not smoke or use any products that contain nicotine or tobacco. If you need help quitting, ask your doctor. Preventing falls Use tools to help you move around (mobility aids) as needed. These include canes, walkers, scooters, and crutches. Keep rooms well-lit. Put away things on the floor that could make you trip. These include cords and rugs. Install safety rails on stairs. Install grab bars in bathrooms. Use rubber mats in slippery areas, like bathrooms. Wear shoes that: Fit you well. Support your feet. Have closed toes. Have rubber soles or low heels. Tell your doctor about all of the medicines you are taking. Some medicines can make you more likely to fall. General instructions Take over-the-counter and prescription medicines only as told by your doctor. Keep all follow-up visits. Contact a doctor if: You have not been tested (screened) for osteoporosis and you are: A woman who is age 74 or older. A man who is age 35 or older. Get help right away if: You fall. You get hurt. Summary Osteoporosis happens when your  bones get thin and weak. Weak bones can break (fracture) more easily. Eat plenty of calcium and vitamin D. These are good for your bones. Tell your doctor about all of the medicines that you take. This information is not intended to replace advice given to you by your health care provider. Make sure you discuss any questions you have with your health care  provider. Document Revised: 03/06/2020 Document Reviewed: 03/06/2020 Elsevier Patient Education  2023 Elsevier Inc.  

## 2022-07-21 NOTE — Progress Notes (Unsigned)
Robin Henry , 05-02-1956, 66 y.o., female MRN: [id number] Patient Care Team    Relationship Specialty Notifications Start End  Ma Hillock, DO PCP - General Family Medicine  10/29/16   Jerrell Belfast, MD Consulting Physician Otolaryngology  11/15/16   Clarene Essex, MD Consulting Physician Gastroenterology  04/25/18   Denton Meek  Gynecology  10/20/21   Macarthur Critchley, [place] Referring Physician Optometry  10/20/21   Hollar, Katharine Look, MD Referring Physician Dermatology  10/20/21   Sherlynn Stalls, MD Consulting Physician Ophthalmology  10/20/21   [facility]    03/15/22     Chief Complaint  Patient presents with   prolia injection     Subjective: Pt presents for an OV with complaints of *** of *** duration.  Associated symptoms include ***.  Pt has tried *** to ease their symptoms.      06/22/2022    9:40 AM 10/20/2021    1:20 PM 03/10/2021   10:38 AM 02/06/2019    8:22 AM 02/20/2018    3:13 PM  Depression screen PHQ 2/9  Decreased Interest 0 0 0 0 0  Down, Depressed, Hopeless 0 0 0 0 0  PHQ - 2 Score 0 0 0 0 0    No Known Allergies Social History   Social History Narrative   ** Merged History Encounter **       Widow, no children. 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  [place] resident since 1989. 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 Exercises >3 X/week.    Past Medical History:  Diagnosis Date   Chronic paronychia of finger of left hand 10/10/2018   Foot cramps 02/06/2019   Hoarseness 2018   with cough; laryngoscopy by Dr. Wilburn Cornelia showed all normal except postglottic erythema c/w reflux.  Pt started on PPI q 5 PM.  If cough persists, consider referral to [facility].   Laryngopharyngeal reflux (LPR) 11/2016   Macular dystrophy    Hx of detached retina (Dr. Baird Cancer)   Osteoarthritis, multiple sites    Primarily knees   Tinea corporis 02/06/2019   Past Surgical History:  Procedure Laterality Date   ABDOMINAL HYSTERECTOMY  2001   fibroids   COLONOSCOPY   09/08/2012   Normal.  Recall 10 yrs.  Procedure: COLONOSCOPY;  Surgeon: Jeryl Columbia, MD;  Location: [facility];  Service: Endoscopy;  Laterality: N/A;  wants no sedation-but start iv   UPPER GASTROINTESTINAL ENDOSCOPY  04/17/2018   neg H.Pylori, terminal ileum eroded chronic active ileitis negative for dysplasia, CMV neg.    Family History  Problem Relation Age of Onset   Hypertension Mother    Emphysema Mother    Cancer Mother        cartoid   Alcohol abuse Sister    Alcohol abuse Maternal Grandmother    Lung cancer Maternal Grandmother    Colon cancer Paternal Grandfather    Allergies as of 07/21/2022   No Known Allergies      Medication List        Accurate as of July 21, 2022  3:47 PM. If you have any questions, ask your nurse or doctor.          STOP taking these medications    amoxicillin-clavulanate 875-125 MG tablet Commonly known as: AUGMENTIN Stopped by: Howard Pouch, DO   [place] by: Howard Pouch, DO       TAKE these medications    ALIGN PO Take by mouth.  atorvastatin 10 MG tablet Commonly known as: LIPITOR Take 1 tablet by mouth daily.   CALCIUM 1200 PO Take by mouth 2 (two) times daily.   denosumab 60 MG/ML Sosy injection Commonly known as: PROLIA Inject 1 syringe at MD office every 6 months What changed: Another medication with the same name was removed. Continue taking this medication, and follow the directions you see here. Changed by: Howard Pouch, DO   diclofenac 75 MG EC tablet Commonly known as: VOLTAREN Take 1 tablet by mouth 2 times daily.   fish oil-omega-3 fatty acids 1000 MG capsule Take 2 g by mouth daily.   fluconazole 150 MG tablet Commonly known as: Diflucan TAKE 1 TABLET BY MOUTH ONCE. MAY REPEAT IN 2 DAYS..   PreserVision AREDS 2 Caps Take 1 softgel in the morning and 1 in the evening with food   VISION-VITE PRESERVE PO Take by mouth daily.   VITAMIN D (CHOLECALCIFEROL) PO Take 5,000  mg by mouth 1 day or 1 dose.        All past medical history, surgical history, allergies, family history, immunizations andmedications were updated in the EMR today and reviewed under the history and medication portions of their EMR.     ROS Negative, with the exception of above mentioned in HPI   Objective:  There were no vitals taken for this visit. There is no height or weight on file to calculate BMI.  Physical Exam   No results found. No results found. No results found for this or any previous visit (from the past 24 hour(s)).  Assessment/Plan: Robin Henry is a 66 y.o. female present for OV for  *** Reviewed expectations re: course of current medical issues. Discussed self-management of symptoms. Outlined signs and symptoms indicating need for more acute intervention. Patient verbalized understanding and all questions were answered. Patient received an After-Visit Summary.    No orders of the defined types were placed in this encounter.  No orders of the defined types were placed in this encounter.  Referral Orders  No referral(s) requested today     Note is dictated utilizing voice recognition software. Although note has been proof read prior to signing, occasional typographical errors still can be missed. If any questions arise, please do not hesitate to call for verification.   electronically signed by:  Howard Pouch, DO  [place]

## 2022-07-22 ENCOUNTER — Encounter: Payer: Self-pay | Admitting: Family Medicine

## 2022-07-22 ENCOUNTER — Other Ambulatory Visit (HOSPITAL_COMMUNITY): Payer: Self-pay

## 2022-07-26 ENCOUNTER — Telehealth: Payer: Self-pay

## 2022-07-26 NOTE — Telephone Encounter (Signed)
[facility] - Client Nonclinical Telephone Record  [facility] - Client Client Site [place] - Day Provider Raoul Pitch, [place] Type Call Who Is Calling Patient / Member / Family / Caregiver Caller Name Theone Stanley Phone Number [phone] Patient Name Robin Henry Patient DOB [birth year] Call Type Message Only Information Provided Reason for Call Request for General Office Information Initial Comment Caller states she is calling from [facility]. Caller says she would like to leave a message for [employer]. Caller states the Rx has been approved for the Pt and they will be sending over the approval form on Monday. Disp. Time Disposition Final User 07/23/2022 5:34:42 PM General Information Provided Yes Joneen Caraway, Katrina Call Closed By: Dillard Cannon Transaction Date/Time: 07/23/2022 5:30:46 PM (ET

## 2022-07-30 ENCOUNTER — Other Ambulatory Visit (HOSPITAL_COMMUNITY): Payer: Self-pay

## 2022-08-03 ENCOUNTER — Ambulatory Visit
Admission: RE | Admit: 2022-08-03 | Discharge: 2022-08-03 | Disposition: A | Discharge status: Home / Self Care | Payer: Medicare Other | Source: Ambulatory Visit | Attending: Family Medicine | Admitting: Family Medicine

## 2022-08-03 DIAGNOSIS — Z1231 Encounter for screening mammogram for malignant neoplasm of breast: Secondary | ICD-10-CM

## 2022-08-04 ENCOUNTER — Telehealth: Payer: Self-pay | Admitting: Family Medicine

## 2022-08-04 NOTE — Telephone Encounter (Signed)
[facility] called asking to speak with Lorriane Shire statting that she is familiar with situation. IT is in regards to having a medication covered with medicare. She ask that Lorriane Shire give her a call for a status update.

## 2022-08-05 NOTE — Telephone Encounter (Signed)
Call insurance

## 2022-08-06 NOTE — Telephone Encounter (Signed)
Pt informed that prolia was approved for the year. Pt will call to schedule.

## 2022-08-06 NOTE — Telephone Encounter (Signed)
Pt states that insurance will not cover the current location of where her prolia was going. Pt is requesting to have a ne Rx [facility]. She also wants to know if she can do the injection herself as she has done it before and it is more cost efficient.   Please advise

## 2022-08-09 NOTE — Telephone Encounter (Signed)
If we provide Prolia prescription, we have to provide it in our office.

## 2022-08-12 NOTE — Telephone Encounter (Signed)
Advised pt that PCP could not send Rx to pharmacy and she would have to come into office to have injection done and it would need to go thru her insurance also.

## 2022-08-17 DIAGNOSIS — H5213 Myopia, bilateral: Secondary | ICD-10-CM | POA: Diagnosis not present

## 2022-08-18 ENCOUNTER — Other Ambulatory Visit (HOSPITAL_COMMUNITY): Payer: Self-pay

## 2022-08-19 DIAGNOSIS — L821 Other seborrheic keratosis: Secondary | ICD-10-CM | POA: Diagnosis not present

## 2022-08-19 DIAGNOSIS — D1801 Hemangioma of skin and subcutaneous tissue: Secondary | ICD-10-CM | POA: Diagnosis not present

## 2022-08-19 DIAGNOSIS — L814 Other melanin hyperpigmentation: Secondary | ICD-10-CM | POA: Diagnosis not present

## 2022-08-19 DIAGNOSIS — Z08 Encounter for follow-up examination after completed treatment for malignant neoplasm: Secondary | ICD-10-CM | POA: Diagnosis not present

## 2022-09-08 DIAGNOSIS — Z1272 Encounter for screening for malignant neoplasm of vagina: Secondary | ICD-10-CM | POA: Diagnosis not present

## 2022-09-08 DIAGNOSIS — Z90710 Acquired absence of both cervix and uterus: Secondary | ICD-10-CM | POA: Diagnosis not present

## 2022-09-08 DIAGNOSIS — Z01419 Encounter for gynecological examination (general) (routine) without abnormal findings: Secondary | ICD-10-CM | POA: Diagnosis not present

## 2022-09-10 ENCOUNTER — Other Ambulatory Visit (HOSPITAL_COMMUNITY): Payer: Self-pay

## 2022-10-07 DIAGNOSIS — Z1321 Encounter for screening for nutritional disorder: Secondary | ICD-10-CM | POA: Diagnosis not present

## 2022-10-07 DIAGNOSIS — E559 Vitamin D deficiency, unspecified: Secondary | ICD-10-CM | POA: Diagnosis not present

## 2022-10-07 DIAGNOSIS — Z780 Asymptomatic menopausal state: Secondary | ICD-10-CM | POA: Diagnosis not present

## 2022-10-07 DIAGNOSIS — M810 Age-related osteoporosis without current pathological fracture: Secondary | ICD-10-CM | POA: Diagnosis not present

## 2022-10-07 DIAGNOSIS — Z79899 Other long term (current) drug therapy: Secondary | ICD-10-CM | POA: Diagnosis not present

## 2022-10-08 ENCOUNTER — Ambulatory Visit: Payer: Medicare Other

## 2022-10-21 ENCOUNTER — Ambulatory Visit (INDEPENDENT_AMBULATORY_CARE_PROVIDER_SITE_OTHER): Payer: Medicare Other | Admitting: Family Medicine

## 2022-10-21 ENCOUNTER — Encounter: Payer: Self-pay | Admitting: Family Medicine

## 2022-10-21 ENCOUNTER — Other Ambulatory Visit (HOSPITAL_COMMUNITY): Payer: Self-pay

## 2022-10-21 VITALS — BP 128/81 | HR 67 | Temp 97.8°F | Ht 66.54 in | Wt 140.4 lb

## 2022-10-21 DIAGNOSIS — Z0000 Encounter for general adult medical examination without abnormal findings: Secondary | ICD-10-CM

## 2022-10-21 DIAGNOSIS — E785 Hyperlipidemia, unspecified: Secondary | ICD-10-CM

## 2022-10-21 DIAGNOSIS — E559 Vitamin D deficiency, unspecified: Secondary | ICD-10-CM | POA: Diagnosis not present

## 2022-10-21 DIAGNOSIS — M810 Age-related osteoporosis without current pathological fracture: Secondary | ICD-10-CM

## 2022-10-21 DIAGNOSIS — Z1321 Encounter for screening for nutritional disorder: Secondary | ICD-10-CM | POA: Diagnosis not present

## 2022-10-21 DIAGNOSIS — M159 Polyosteoarthritis, unspecified: Secondary | ICD-10-CM | POA: Diagnosis not present

## 2022-10-21 DIAGNOSIS — Z79899 Other long term (current) drug therapy: Secondary | ICD-10-CM | POA: Diagnosis not present

## 2022-10-21 DIAGNOSIS — Z1231 Encounter for screening mammogram for malignant neoplasm of breast: Secondary | ICD-10-CM

## 2022-10-21 DIAGNOSIS — Z780 Asymptomatic menopausal state: Secondary | ICD-10-CM | POA: Diagnosis not present

## 2022-10-21 LAB — COMPREHENSIVE METABOLIC PANEL WITH GFR
ALT: 46 U/L — ABNORMAL HIGH (ref 0–35)
AST: 27 U/L (ref 0–37)
Albumin: 4.5 g/dL (ref 3.5–5.2)
Alkaline Phosphatase: 72 U/L (ref 39–117)
BUN: 13 mg/dL (ref 6–23)
CO2: 30 meq/L (ref 19–32)
Calcium: 9.8 mg/dL (ref 8.4–10.5)
Chloride: 102 meq/L (ref 96–112)
Creatinine, Ser: 0.87 mg/dL (ref 0.40–1.20)
GFR: 68.81 mL/min
Glucose, Bld: 82 mg/dL (ref 70–99)
Potassium: 4.2 meq/L (ref 3.5–5.1)
Sodium: 140 meq/L (ref 135–145)
Total Bilirubin: 0.8 mg/dL (ref 0.2–1.2)
Total Protein: 7.1 g/dL (ref 6.0–8.3)

## 2022-10-21 LAB — VITAMIN D 25 HYDROXY (VIT D DEFICIENCY, FRACTURES): VITD: 52.71 ng/mL (ref 30.00–100.00)

## 2022-10-21 LAB — LIPID PANEL
Cholesterol: 163 mg/dL (ref 0–200)
HDL: 62.7 mg/dL (ref >39.00)
LDL Cholesterol: 85 mg/dL (ref 0–99)
NonHDL: 100.74
Total CHOL/HDL Ratio: 3
Triglycerides: 78 mg/dL (ref 0.0–149.0)
VLDL: 15.6 mg/dL (ref 0.0–40.0)

## 2022-10-21 LAB — TSH: TSH: 2.56 u[IU]/mL (ref 0.35–5.50)

## 2022-10-21 LAB — CBC
HCT: 41.6 % (ref 36.0–46.0)
Hemoglobin: 14.1 g/dL (ref 12.0–15.0)
MCHC: 34 g/dL (ref 30.0–36.0)
MCV: 87.4 fl (ref 78.0–100.0)
Platelets: 288 K/uL (ref 150.0–400.0)
RBC: 4.76 Mil/uL (ref 3.87–5.11)
RDW: 13.4 % (ref 11.5–15.5)
WBC: 7.8 K/uL (ref 4.0–10.5)

## 2022-10-21 LAB — HEMOGLOBIN A1C: Hgb A1c MFr Bld: 5.8 % (ref 4.6–6.5)

## 2022-10-21 MED ORDER — DICLOFENAC SODIUM 75 MG PO TBEC
75.0000 mg | DELAYED_RELEASE_TABLET | Freq: Two times a day (BID) | ORAL | 3 refills | Status: DC
  Filled 2022-10-21 – 2022-12-08 (×2): qty 90, 45d supply, fill #0
  Filled 2023-02-23: qty 90, 45d supply, fill #1

## 2022-10-21 MED ORDER — ATORVASTATIN CALCIUM 10 MG PO TABS
10.0000 mg | ORAL_TABLET | Freq: Every day | ORAL | 3 refills | Status: DC
  Filled 2022-10-21 – 2022-12-08 (×2): qty 90, 90d supply, fill #0
  Filled 2023-02-23: qty 90, 90d supply, fill #1
  Filled 2023-06-15: qty 90, 90d supply, fill #2

## 2022-10-21 NOTE — Patient Instructions (Signed)
No follow-ups on file.        Great to see you today.  I have refilled the medication(s) we provide.   If labs were collected, we will inform you of lab results once received either by echart message or telephone call.   - echart message- for normal results that have been seen by the patient already.   - telephone call: abnormal results or if patient has not viewed results in their echart.  

## 2022-10-21 NOTE — Progress Notes (Signed)
Patient ID: Robin Henry, female  DOB: [birth year], 67 y.o.   MRN: [id number] Patient Care Team    Relationship Specialty Notifications Start End  Ma Hillock, DO PCP - General Family Medicine  10/29/16   Jerrell Belfast, MD Consulting Physician Otolaryngology  11/15/16   Clarene Essex, MD Consulting Physician Gastroenterology  04/25/18   Denton Meek  Gynecology  10/20/21   Macarthur Critchley, [place] Referring Physician Optometry  10/20/21   Hollar, Katharine Look, MD Referring Physician Dermatology  10/20/21   Sherlynn Stalls, MD Consulting Physician Ophthalmology  10/20/21   [facility]    03/15/22     Chief Complaint  Patient presents with   Annual Exam    Pt is fasting    Subjective: Robin Henry is a 67 y.o.  Female  present for [facility] All past medical history, surgical history, allergies, family history, immunizations, medications and social history were updated in the electronic medical record today. All recent labs, ED visits and hospitalizations within the last year were reviewed.  Colonoscopy: completed 04/13/2018, by [facility], follow up 10 year. Fhx colon cancer.  Mammogram: completed: 07/2022  [facility]> ordered placed for 2024 Cervical cancer screening: Hysterectomy> GYN Immunizations: tdap UTD 2016, Influenza UTD 07/2022 UTD(encouraged yearly),zostavax 11/14/2015. Shingrix x1? only 2019. Covid series completed. PNA20 completed Infectious disease screening: Hep C and HIV completed DEXA: last completed 11/2021 scheduled >(osteoporosis) prolia q 6 mos (through osteoporosis clinic[facility]) Assistive device: none Oxygen HEN:IDPO Patient has a Dental home. Hospitalizations/ED visits: reviewed     06/22/2022    9:40 AM 10/20/2021    1:20 PM 03/10/2021   10:38 AM 02/06/2019    8:22 AM 02/20/2018    3:13 PM  Depression screen PHQ 2/9  Decreased Interest 0 0 0 0 0  Down, Depressed, Hopeless 0 0 0 0 0  PHQ - 2 Score 0 0 0 0 0       No data to display          Immunization  History  Administered Date(s) Administered   Influenza,inj,Quad PF,6+ Mos 07/07/2022   Influenza-Unspecified 07/04/2017, 07/02/2021   PFIZER(Purple Top)SARS-COV-2 Vaccination 09/23/2019, 10/11/2019, 07/21/2020   PNEUMOCOCCAL CONJUGATE-20 10/20/2021   Pfizer Covid-19 Vaccine Bivalent Booster 75yr & up 09/10/2021   Tdap 04/25/2015   Zoster Recombinat (Shingrix) 02/21/2018   Zoster, Live 11/14/2015   Past Medical History:  Diagnosis Date   Chronic paronychia of finger of left hand 10/10/2018   Foot cramps 02/06/2019   Hoarseness 2018   with cough; laryngoscopy by Dr. SWilburn Corneliashowed all normal except postglottic erythema c/w reflux.  Pt started on PPI q 5 PM.  If cough persists, consider referral to [facility] cough [facility].   Laryngopharyngeal reflux (LPR) 11/2016   Macular dystrophy    Hx of detached retina (Dr. SBaird Cancer   Osteoarthritis, multiple sites    Primarily knees   Tinea corporis 02/06/2019   No Known Allergies Past Surgical History:  Procedure Laterality Date   ABDOMINAL HYSTERECTOMY  2001   fibroids   COLONOSCOPY  09/08/2012   Normal.  Recall 10 yrs.  Procedure: COLONOSCOPY;  Surgeon: MJeryl Columbia MD;  Location: [facility];  Service: Endoscopy;  Laterality: N/A;  wants no sedation-but start iv   UPPER GASTROINTESTINAL ENDOSCOPY  04/17/2018   neg H.Pylori, terminal ileum eroded chronic active ileitis negative for dysplasia, CMV neg.    Family History  Problem Relation Age of Onset   Hypertension Mother    Emphysema Mother  Cancer Mother        cartoid   Alcohol abuse Sister    Alcohol abuse Maternal Grandmother    Lung cancer Maternal Grandmother    Colon cancer Paternal Grandfather    Social History   Social History Narrative   ** Merged History Encounter **       Widow, no children. 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     Allergies as of 10/21/2022   No Known Allergies       Medication List        Accurate as of October 21, 2022 10:35 AM. If you have any questions, ask your nurse or doctor.          ALIGN PO Take by mouth.   atorvastatin 10 MG tablet Commonly known as: LIPITOR Take 1 tablet by mouth daily.   CALCIUM 1200 PO Take by mouth 2 (two) times daily.   diclofenac 75 MG EC tablet Commonly known as: VOLTAREN Take 1 tablet by mouth 2 times daily.   fish oil-omega-3 fatty acids 1000 MG capsule Take 2 g by mouth daily.   fluconazole 150 MG tablet Commonly known as: Diflucan TAKE 1 TABLET BY MOUTH ONCE. MAY REPEAT IN 2 DAYS..   PreserVision AREDS 2 Caps Take 1 softgel in the morning and 1 in the evening with food   VISION-VITE PRESERVE PO Take by mouth daily.   VITAMIN D (CHOLECALCIFEROL) PO Take 5,000 mg by mouth 1 day or 1 dose.        All past medical history, surgical history, allergies, family history, immunizations andmedications were updated in the EMR today and reviewed under the history and medication portions of their EMR.     No results found for this or any previous visit (from the past 2160 hour(s)).  ROS 14 pt review of systems performed and negative (unless mentioned in an HPI)  Objective BP 128/81   Pulse 67   Temp 97.8 F (36.6 C)   Ht 5' 6.54" (1.69 m)   Wt 140 lb 6.4 oz (63.7 kg)   SpO2 98%   BMI 22.30 kg/m  Physical Exam Vitals and nursing note reviewed.  Constitutional:      General: She is not in acute distress.    Appearance: Normal appearance. She is not ill-appearing or toxic-appearing.  HENT:     Head: Normocephalic and atraumatic.     Right Ear: Tympanic membrane, ear canal and external ear normal. There is no impacted cerumen.     Left Ear: Tympanic membrane, ear canal and external ear normal. There is no impacted cerumen.     Nose: No congestion or rhinorrhea.     Mouth/Throat:     Mouth: Mucous membranes are moist.     Pharynx: Oropharynx is clear. No oropharyngeal exudate or  posterior oropharyngeal erythema.  Eyes:     General: No scleral icterus.       Right eye: No discharge.        Left eye: No discharge.     Extraocular Movements: Extraocular movements intact.     Conjunctiva/sclera: Conjunctivae normal.     Pupils: Pupils are equal, round, and reactive to light.  Cardiovascular:     Rate and Rhythm: Normal rate and regular rhythm.     Pulses: Normal pulses.     Heart sounds: Normal heart sounds. No murmur heard.    No friction rub. No gallop.  Pulmonary:  Effort: Pulmonary effort is normal. No respiratory distress.     Breath sounds: Normal breath sounds. No stridor. No wheezing, rhonchi or rales.  Chest:     Chest wall: No tenderness.  Abdominal:     General: Abdomen is flat. Bowel sounds are normal. There is no distension.     Palpations: Abdomen is soft. There is no mass.     Tenderness: There is no abdominal tenderness. There is no right CVA tenderness, left CVA tenderness, guarding or rebound.     Hernia: No hernia is present.  Musculoskeletal:        General: No swelling, tenderness or deformity. Normal range of motion.     Cervical back: Normal range of motion and neck supple. No rigidity or tenderness.     Right lower leg: No edema.     Left lower leg: No edema.  Lymphadenopathy:     Cervical: No cervical adenopathy.  Skin:    General: Skin is warm and dry.     Coloration: Skin is not jaundiced or pale.     Findings: No bruising, erythema, lesion or rash.  Neurological:     General: No focal deficit present.     Mental Status: She is alert and oriented to person, place, and time. Mental status is at baseline.     Cranial Nerves: No cranial nerve deficit.     Sensory: No sensory deficit.     Motor: No weakness.     Coordination: Coordination normal.     Gait: Gait normal.     Deep Tendon Reflexes: Reflexes normal.  Psychiatric:        Mood and Affect: Mood normal.        Behavior: Behavior normal.        Thought Content:  Thought content normal.        Judgment: Judgment normal.      No results found.  Assessment/plan: Robin Henry is a 67 y.o. female present for [facility] Breast cancer screening by mammogram Ordered for 2024 screen Osteoporosis without current pathological fracture, unspecified osteoporosis type Prolia q 6 mos> osteoporosis clinic - TSH - VITAMIN D 25 Hydroxy (Vit-D Deficiency, Fractures) Vitamin D deficiency - VITAMIN D 25 Hydroxy (Vit-D Deficiency, Fractures) Hyperlipidemia LDL goal <130 Continue lipitor 10 mg - CBC - Comprehensive metabolic panel - Lipid panel Encounter for long-term current use of medication - Hemoglobin A1c Osteoarthritis  - cmp Continue diclofenac once daily with food.  Explained to patient she will be seen yearly for [facility]. [facility]-medicare patients do not get a CPE by provider yearly, they receive a MW with health coach.   No follow-ups on file.  Orders Placed This Encounter  Procedures   MM 3D SCREEN BREAST BILATERAL   CBC   Comprehensive metabolic panel   Hemoglobin A1c   Lipid panel   TSH   VITAMIN D 25 Hydroxy (Vit-D Deficiency, Fractures)   No orders of the defined types were placed in this encounter.  Referral Orders  No referral(s) requested today     Electronically signed by: Howard Pouch, [place]

## 2022-10-22 ENCOUNTER — Telehealth: Payer: Self-pay | Admitting: Family Medicine

## 2022-10-22 DIAGNOSIS — R7401 Elevation of levels of liver transaminase levels: Secondary | ICD-10-CM

## 2022-10-22 NOTE — Telephone Encounter (Signed)
Spoke with patient regarding results/recommendations.  

## 2022-10-22 NOTE — Telephone Encounter (Signed)
Please call patient kidney and thyroid function are normal Blood cell counts and electrolytes are normal Diabetes screening/A1c is normal  Cholesterol panel is at goal for her. Vitamin D levels are excellent. One of her liver enzymes, called ALT, is very mildly elevated.  It is not concerning at this level.  However we should recheck her enzymes in a couple weeks to ensure they are not continuing to rise and remain stable. I have placed a future lab order for her to have this by lab appointment only.  She is encouraged to have eaten prior to the lab being collected.  Sometimes liver enzymes are mildly elevated and fasting states. 4 weeks-lab appointment only

## 2022-10-22 NOTE — Telephone Encounter (Signed)
LM for pt to return call to discuss.  

## 2022-11-03 ENCOUNTER — Other Ambulatory Visit (HOSPITAL_COMMUNITY): Payer: Self-pay

## 2022-11-19 ENCOUNTER — Other Ambulatory Visit (INDEPENDENT_AMBULATORY_CARE_PROVIDER_SITE_OTHER): Payer: Medicare Other

## 2022-11-19 DIAGNOSIS — R7401 Elevation of levels of liver transaminase levels: Secondary | ICD-10-CM | POA: Diagnosis not present

## 2022-11-19 LAB — HEPATIC FUNCTION PANEL
ALT: 29 U/L (ref 0–35)
AST: 20 U/L (ref 0–37)
Albumin: 4.3 g/dL (ref 3.5–5.2)
Alkaline Phosphatase: 62 U/L (ref 39–117)
Bilirubin, Direct: 0.2 mg/dL (ref 0.0–0.3)
Total Bilirubin: 0.9 mg/dL (ref 0.2–1.2)
Total Protein: 6.8 g/dL (ref 6.0–8.3)

## 2022-12-08 ENCOUNTER — Other Ambulatory Visit (HOSPITAL_COMMUNITY): Payer: Self-pay

## 2023-02-23 ENCOUNTER — Other Ambulatory Visit (HOSPITAL_COMMUNITY): Payer: Self-pay

## 2023-02-24 DIAGNOSIS — D225 Melanocytic nevi of trunk: Secondary | ICD-10-CM | POA: Diagnosis not present

## 2023-02-24 DIAGNOSIS — Z08 Encounter for follow-up examination after completed treatment for malignant neoplasm: Secondary | ICD-10-CM | POA: Diagnosis not present

## 2023-02-24 DIAGNOSIS — L570 Actinic keratosis: Secondary | ICD-10-CM | POA: Diagnosis not present

## 2023-02-24 DIAGNOSIS — L814 Other melanin hyperpigmentation: Secondary | ICD-10-CM | POA: Diagnosis not present

## 2023-02-24 DIAGNOSIS — L821 Other seborrheic keratosis: Secondary | ICD-10-CM | POA: Diagnosis not present

## 2023-02-24 DIAGNOSIS — Z85828 Personal history of other malignant neoplasm of skin: Secondary | ICD-10-CM | POA: Diagnosis not present

## 2023-03-10 ENCOUNTER — Ambulatory Visit (INDEPENDENT_AMBULATORY_CARE_PROVIDER_SITE_OTHER): Payer: Medicare Other | Admitting: Physician Assistant

## 2023-03-10 ENCOUNTER — Encounter (INDEPENDENT_AMBULATORY_CARE_PROVIDER_SITE_OTHER): Payer: Self-pay

## 2023-03-10 ENCOUNTER — Ambulatory Visit: Payer: Medicare Other | Admitting: Family Medicine

## 2023-03-10 VITALS — BP 128/78 | HR 77 | Temp 98.2°F | Resp 18 | Ht 67.0 in | Wt 136.7 lb

## 2023-03-10 DIAGNOSIS — R059 Cough, unspecified: Secondary | ICD-10-CM

## 2023-03-10 DIAGNOSIS — J029 Acute pharyngitis, unspecified: Secondary | ICD-10-CM

## 2023-03-10 LAB — POC COVID QUICKVUE ANTIGEN: QuickVue SARS COV2 Antigen POCT: NEGATIVE

## 2023-03-10 LAB — POCT INFLUENZA A/B
POCT Rapid Influenza A AG: NEGATIVE
POCT Rapid Influenza B AG: NEGATIVE

## 2023-03-10 LAB — STREP A POCT GOHEALTH: Rapid Strep A Screen POCT: NEGATIVE

## 2023-03-10 NOTE — Progress Notes (Signed)
Patient: Anne Rangel    Date: 03/10/2023   MRN: [id number]        Subjective        Chief Complaint   Patient presents with    Cough     X 3 days, recently traveled to [place], c/o sore throat, laryngitis, dry cough w/ yellowish sputum           Cough       Anne Rangel is a 67 y.o. female presenting to Urgent Care with complaint of non productive cough, sore throat, and subjective fever  that began 3 days ago.  Patient reports sinus congestion and pressure.  States that she was in [place] and recently traveled back here yesterday.  States she had leftover antibiotics, believes 1 was sulfa and the other was cephalosporin.  She took 2 days worth of these antibiotics and states that she now does not have any more of them.  Denies nausea, vomiting, difficulty breathing.        History:  Pertinent Past Medical, Surgical, Family and Social History were reviewed.     Current Outpatient Medications:     atorvastatin (LIPITOR) 10 MG tablet, Take 1 tablet (10 mg) by mouth daily, Disp: , Rfl:     diclofenac (VOLTAREN) 75 MG EC tablet, Take 1 tablet (75 mg) by mouth daily, Disp: , Rfl:   No Known Allergies  Medications and Allergies reviewed.         Objective   Vitals:    03/10/23 1204   BP: 128/78   Pulse: 77   Resp: 18   Temp: 98.2 F (36.8 C)   TempSrc: Tympanic   SpO2: 97%   Weight: 62 kg (136 lb 11.2 oz)   Height: 1.702 m (5\' 7" )     Body mass index is 21.41 kg/m.    Physical Exam  Constitutional:       Appearance: Normal appearance.   HENT:      Right Ear: Tympanic membrane normal.      Left Ear: Tympanic membrane normal.      Nose: No congestion or rhinorrhea.      Right Sinus: No maxillary sinus tenderness or frontal sinus tenderness.      Left Sinus: No maxillary sinus tenderness or frontal sinus tenderness.      Mouth/Throat:      Lips: Pink.      Mouth: Mucous membranes are moist.      Pharynx: Oropharynx is clear. Uvula midline. No pharyngeal swelling, oropharyngeal exudate, posterior oropharyngeal erythema or uvula  swelling.      Tonsils: No tonsillar exudate. 0 on the right. 0 on the left.   Cardiovascular:      Rate and Rhythm: Normal rate and regular rhythm.   Pulmonary:      Effort: Pulmonary effort is normal. No respiratory distress.      Breath sounds: Normal breath sounds. No wheezing, rhonchi or rales.   Neurological:      Mental Status: She is alert.              UCC Course   LABS  The following POCT tests were ordered, reviewed and discussed with the patient/family.     Results       Procedure Component Value Units Date/Time    QuickVue SARS-COV-2 Antigen POCT [[id number]]  (Normal) Collected: 03/10/23 1229    Specimen: Nares Updated: 03/10/23 1229     QuickVue SARS COV2 Antigen POCT Negative    POCT Influenza A/B [[id number]]  (Normal)  Collected: 03/10/23 1229     Updated: 03/10/23 1229     POCT QC Pass     POCT Rapid Influenza A AG Negative     POCT Rapid Influenza B AG Negative    Rapid Strep A POCT [[id number]]  (Normal) Collected: 03/10/23 1228    Specimen: Throat Updated: 03/10/23 1229     POCT QC Pass     Rapid Strep A Screen POCT Negative          There were no x-rays reviewed with this patient during the visit.  No current facility-administered medications for this visit.          Procedures   Procedures      MDM/Assessment    Patient is a 67 year old female presenting with 3 days of cough, congestion, sinus pressure and subjective fever.  Patient states that she was recently in [place] and returned last night.  Patient admits to taking leftover antibiotic she had for the past 2 days but is unsure which antibiotics those were.  VSS.  Physical exam is reassuring with normal lung sounds and normal throat exam.  Patient denies difficulty breathing, nausea, vomiting.  Strep, COVID, flu A/B negative.  Discussed with patient diagnosis of viral upper respiratory infection.  Recommended use of over-the-counter nasal steroid such as Flonase, oral antihistamine and Mucinex D for symptomatic relief.  Patient encouraged to  follow-up with primary care upon her return to [facility].    Differential Diagnosis: Sinusitis, Bronchitis, Pharyngitis, Pneumonia, Influenza, COVID-19 and Allergic Rhinitis  Encounter Diagnosis   Name Primary?    Sore throat Yes            Plan  See MDM    Orders Placed This Encounter   Procedures    QuickVue SARS-COV-2 Antigen POCT    POCT Influenza A/B    Rapid Strep A POCT     Requested Prescriptions      No prescriptions requested or ordered in this encounter       Discussed results and diagnosis with patient/family.  Reviewed warning signs for worsening condition, as well as, indications for follow-up with primary care physician and return to urgent care clinic.   Patient/family expressed understanding of instructions.     An After Visit Summary with pertinent information was made available to patient/family via MyChart or in-print.    Hennie Duos, PA-C

## 2023-03-10 NOTE — Patient Instructions (Signed)
Symptoms consistent with upper respiratory viral illness.  COVID, flu A/B, and strep negative.  Recommend use of nasal steroid (Flonase), over-the-counter antihistamine (Zyrtec, Claritin, Allegra, Xyzal), and over-the-counter decongestant such as Mucinex D for symptom control.  Please follow-up with primary care in [place] if symptoms continue.

## 2023-03-14 ENCOUNTER — Ambulatory Visit (INDEPENDENT_AMBULATORY_CARE_PROVIDER_SITE_OTHER): Payer: Medicare Other | Admitting: Family Medicine

## 2023-03-14 ENCOUNTER — Encounter: Payer: Self-pay | Admitting: Family Medicine

## 2023-03-14 VITALS — BP 121/88 | HR 70 | Temp 97.5°F | Wt 136.0 lb

## 2023-03-14 DIAGNOSIS — B9689 Other specified bacterial agents as the cause of diseases classified elsewhere: Secondary | ICD-10-CM

## 2023-03-14 DIAGNOSIS — J329 Chronic sinusitis, unspecified: Secondary | ICD-10-CM

## 2023-03-14 DIAGNOSIS — F5101 Primary insomnia: Secondary | ICD-10-CM | POA: Diagnosis not present

## 2023-03-14 MED ORDER — DOXYCYCLINE HYCLATE 100 MG PO TABS: 100.0000 mg | ORAL_TABLET | Freq: Two times a day (BID) | ORAL | 0 refills | Status: DC

## 2023-03-14 MED ORDER — TRAZODONE HCL 50 MG PO TABS
25.0000 mg | ORAL_TABLET | Freq: Every evening | ORAL | 5 refills | Status: DC | PRN
Start: 2023-03-14 — End: 2023-10-27

## 2023-03-14 MED ORDER — DICLOFENAC SODIUM 75 MG PO TBEC: 75.0000 mg | DELAYED_RELEASE_TABLET | Freq: Two times a day (BID) | ORAL | 3 refills | Status: DC

## 2023-03-14 NOTE — Progress Notes (Signed)
Patient ID: Robin Henry, female  DOB: 1956/06/08, 67 y.o.   MRN: [id number] Patient Care Team    Relationship Specialty Notifications Start End  Natalia Leatherwood, DO PCP - General Family Medicine  10/29/16   Osborn Coho, MD (Inactive) Consulting Physician Otolaryngology  11/15/16   Vida Rigger, MD Consulting Physician Gastroenterology  04/25/18   Godfrey Pick  Gynecology  10/20/21   Fredrich Birks, OD Referring Physician Optometry  10/20/21   Hollar, Ronal Fear, MD Referring Physician Dermatology  10/20/21   Stephannie Li, MD Consulting Physician Ophthalmology  10/20/21   [facility]    03/15/22     Chief Complaint  Patient presents with   Hoarse    Came back from [place] and got sick seen at [facility] on 06/06; tested for COVID, flu and strep and all neg results.    Subjective: Robin Henry is a 67 y.o.  Female  present for hoarseness All past medical history, surgical history, allergies, family history, immunizations, medications and social history were updated in the electronic medical record today. All recent labs, ED visits and hospitalizations within the last year were reviewed.  Pt reports she was in [place] and started to become ill. She was seen at [facility] on 6/6 and tested negative for covid, flu and strep.  Today she reports hoarseness, sinus pressure and PND. She is feeling fatigued.    Insomnia: Pt reports she is struggling with insomnia. Her mother and sister are prescribed trazodone and she is wondering if she should try. Currently she takes melatonin 10 mg and uses nyquil almost nightly.      06/22/2022    9:40 AM 10/20/2021    1:20 PM 03/10/2021   10:38 AM 02/06/2019    8:22 AM 02/20/2018    3:13 PM  Depression screen PHQ 2/9  Decreased Interest 0 0 0 0 0  Down, Depressed, Hopeless 0 0 0 0 0  PHQ - 2 Score 0 0 0 0 0       No data to display          Immunization History  Administered Date(s) Administered   Influenza,inj,Quad PF,6+ Mos 07/07/2022    Influenza-Unspecified 07/04/2017, 07/02/2021   PFIZER(Purple Top)SARS-COV-2 Vaccination 09/23/2019, 10/11/2019, 07/21/2020   PNEUMOCOCCAL CONJUGATE-20 10/20/2021   Pfizer Covid-19 Vaccine Bivalent Booster 81yrs & up 09/10/2021   Tdap 04/25/2015   Zoster Recombinat (Shingrix) 02/21/2018   Zoster, Live 11/14/2015   Past Medical History:  Diagnosis Date   Chronic paronychia of finger of left hand 10/10/2018   Foot cramps 02/06/2019   Hoarseness 2018   with cough; laryngoscopy by Dr. Annalee Genta showed all normal except postglottic erythema c/w reflux.  Pt started on PPI q 5 PM.  If cough persists, consider referral to [facility].   Laryngopharyngeal reflux (LPR) 11/2016   Macular dystrophy    Hx of detached retina (Dr. Allyne Gee)   Osteoarthritis, multiple sites    Primarily knees   Tinea corporis 02/06/2019   No Known Allergies Past Surgical History:  Procedure Laterality Date   ABDOMINAL HYSTERECTOMY  2001   fibroids   COLONOSCOPY  09/08/2012   Normal.  Recall 10 yrs.  Procedure: COLONOSCOPY;  Surgeon: Petra Kuba, MD;  Location: [facility];  Service: Endoscopy;  Laterality: N/A;  wants no sedation-but start iv   UPPER GASTROINTESTINAL ENDOSCOPY  04/17/2018   neg H.Pylori, terminal ileum eroded chronic active ileitis negative for dysplasia, CMV neg.    Family History  Problem Relation Age of Onset   Hypertension Mother    Emphysema Mother    Cancer Mother        cartoid   Alcohol abuse Sister    Alcohol abuse Maternal Grandmother    Lung cancer Maternal Grandmother    Colon cancer Paternal Grandfather    Social History   Social History Narrative   ** Merged History Encounter **       Widow, no children. 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     Allergies as of 03/14/2023   No Known Allergies      Medication List        Accurate as of March 14, 2023 10:34 AM. If you have any questions,  ask your nurse or doctor.          STOP taking these medications    VITAMIN D (CHOLECALCIFEROL) PO Stopped by: Felix Pacini, DO       TAKE these medications    ALIGN PO Take by mouth.   atorvastatin 10 MG tablet Commonly known as: LIPITOR Take 1 tablet (10 mg total) by mouth daily.   CALCIUM 1200 PO Take by mouth 2 (two) times daily.   diclofenac 75 MG EC tablet Commonly known as: VOLTAREN Take 1 tablet by mouth 2 times daily.   doxycycline 100 MG tablet Commonly known as: VIBRA-TABS Take 1 tablet (100 mg total) by mouth 2 (two) times daily. Started by: Felix Pacini, DO   fish oil-omega-3 fatty acids 1000 MG capsule Take 2 g by mouth daily.   traZODone 50 MG tablet Commonly known as: DESYREL Take 0.5-1.5 tablets (25-75 mg total) by mouth at bedtime as needed for sleep. Started by: Felix Pacini, DO   PreserVision AREDS 2 Caps Take 1 softgel in the morning and 1 in the evening with food   VISION-VITE PRESERVE PO Take by mouth daily.        All past medical history, surgical history, allergies, family history, immunizations andmedications were updated in the EMR today and reviewed under the history and medication portions of their EMR.     No results found for this or any previous visit (from the past 2160 hour(s)).  Review of Systems  Constitutional:  Positive for malaise/fatigue. Negative for chills and fever.  HENT:  Positive for congestion, ear pain, sinus pain and sore throat.   Respiratory:  Positive for cough.   Skin:  Negative for rash.  Neurological:  Positive for headaches. Negative for dizziness.   14 pt review of systems performed and negative (unless mentioned in an HPI)  Objective BP 121/88   Pulse 70   Temp (!) 97.5 F (36.4 C)   Wt 136 lb (61.7 kg)   SpO2 97%   BMI 21.60 kg/m  Physical Exam Vitals and nursing note reviewed.  Constitutional:      General: She is not in acute distress.    Appearance: Normal appearance. She is  normal weight. She is not ill-appearing or toxic-appearing.  HENT:     Head: Normocephalic and atraumatic.     Right Ear: Tympanic membrane and ear canal normal.     Left Ear: Tympanic membrane and ear canal normal.     Nose: Congestion and rhinorrhea present.     Mouth/Throat:     Mouth: Mucous membranes are moist.     Pharynx: No oropharyngeal exudate or posterior oropharyngeal erythema.     Comments: pnd Eyes:  General: No scleral icterus.       Right eye: No discharge.        Left eye: No discharge.     Extraocular Movements: Extraocular movements intact.     Conjunctiva/sclera: Conjunctivae normal.     Pupils: Pupils are equal, round, and reactive to light.  Cardiovascular:     Rate and Rhythm: Normal rate and regular rhythm.     Heart sounds: No murmur heard. Pulmonary:     Effort: Pulmonary effort is normal. No respiratory distress.     Breath sounds: Normal breath sounds. No wheezing, rhonchi or rales.  Musculoskeletal:     Cervical back: Tenderness present.     Right lower leg: No edema.     Left lower leg: No edema.  Lymphadenopathy:     Cervical: Cervical adenopathy present.  Skin:    Findings: No rash.  Neurological:     Mental Status: She is alert and oriented to person, place, and time. Mental status is at baseline.     Motor: No weakness.     Coordination: Coordination normal.     Gait: Gait normal.  Psychiatric:        Mood and Affect: Mood normal.        Behavior: Behavior normal.        Thought Content: Thought content normal.        Judgment: Judgment normal.      No results found.  Assessment/plan: Robin Henry is a 67 y.o. female present for  Bacterial sinusitis Rest, hydrate.  +/- flonase, mucinex (DM if cough), nettie pot or nasal saline.  Doxy bid prescribed, take until completed.  If cough present it can last up to 6-8 weeks.  F/U 2-4 weeks if not improved.    Primary insomnia Trial of trazodone 25-75 mg qhs.  Avoid use of nyquil  and other sleep aides when using trazodone F/u if needing refills.      Return in about 4 weeks (around 04/11/2023), or if symptoms worsen or fail to improve.  No orders of the defined types were placed in this encounter.  Meds ordered this encounter  Medications   doxycycline (VIBRA-TABS) 100 MG tablet    Sig: Take 1 tablet (100 mg total) by mouth 2 (two) times daily.    Dispense:  20 tablet    Refill:  0   traZODone (DESYREL) 50 MG tablet    Sig: Take 0.5-1.5 tablets (25-75 mg total) by mouth at bedtime as needed for sleep.    Dispense:  90 tablet    Refill:  5   diclofenac (VOLTAREN) 75 MG EC tablet    Sig: Take 1 tablet by mouth 2 times daily.    Dispense:  90 tablet    Refill:  3   Referral Orders  No referral(s) requested today     Electronically signed by: Felix Pacini, DO [facility] [place]

## 2023-03-14 NOTE — Patient Instructions (Addendum)
Return in about 4 weeks (around 04/11/2023), or if symptoms worsen or fail to improve.  Or if you would like to discuss arthritis labs.       Great to see you today.  I have refilled the medication(s) we provide.   If labs were collected, we will inform you of lab results once received either by echart message or telephone call.   - echart message- for normal results that have been seen by the patient already.   - telephone call: abnormal results or if patient has not viewed results in their echart.

## 2023-04-19 DIAGNOSIS — Z79899 Other long term (current) drug therapy: Secondary | ICD-10-CM | POA: Diagnosis not present

## 2023-04-19 DIAGNOSIS — M810 Age-related osteoporosis without current pathological fracture: Secondary | ICD-10-CM | POA: Diagnosis not present

## 2023-04-26 ENCOUNTER — Other Ambulatory Visit (HOSPITAL_COMMUNITY): Payer: Self-pay

## 2023-04-26 DIAGNOSIS — Z79899 Other long term (current) drug therapy: Secondary | ICD-10-CM | POA: Diagnosis not present

## 2023-04-26 DIAGNOSIS — E559 Vitamin D deficiency, unspecified: Secondary | ICD-10-CM | POA: Diagnosis not present

## 2023-04-26 DIAGNOSIS — M810 Age-related osteoporosis without current pathological fracture: Secondary | ICD-10-CM | POA: Diagnosis not present

## 2023-04-26 MED ORDER — AMOXICILLIN 875 MG PO TABS
875.0000 mg | ORAL_TABLET | Freq: Two times a day (BID) | ORAL | 0 refills | Status: DC
  Filled 2023-04-26: qty 14, 7d supply, fill #0

## 2023-04-27 ENCOUNTER — Other Ambulatory Visit (HOSPITAL_COMMUNITY): Payer: Self-pay

## 2023-04-27 MED ORDER — AMOXICILLIN-POT CLAVULANATE 875-125 MG PO TABS
ORAL_TABLET | ORAL | 0 refills | Status: DC
  Filled 2023-04-27: qty 14, 7d supply, fill #0

## 2023-05-09 ENCOUNTER — Other Ambulatory Visit (HOSPITAL_COMMUNITY): Payer: Self-pay

## 2023-05-09 MED ORDER — LEVOFLOXACIN 500 MG PO TABS
500.0000 mg | ORAL_TABLET | Freq: Every day | ORAL | 0 refills | Status: DC
  Filled 2023-05-09: qty 10, 10d supply, fill #0

## 2023-05-11 ENCOUNTER — Other Ambulatory Visit (HOSPITAL_COMMUNITY): Payer: Self-pay

## 2023-05-11 DIAGNOSIS — M25561 Pain in right knee: Secondary | ICD-10-CM | POA: Diagnosis not present

## 2023-05-11 DIAGNOSIS — M25562 Pain in left knee: Secondary | ICD-10-CM | POA: Diagnosis not present

## 2023-05-11 MED ORDER — METHYLPREDNISOLONE 4 MG PO TBPK
ORAL_TABLET | ORAL | 0 refills | Status: DC
  Filled 2023-05-11: qty 21, 6d supply, fill #0

## 2023-05-17 DIAGNOSIS — M25562 Pain in left knee: Secondary | ICD-10-CM | POA: Diagnosis not present

## 2023-05-17 DIAGNOSIS — M25561 Pain in right knee: Secondary | ICD-10-CM | POA: Diagnosis not present

## 2023-05-17 DIAGNOSIS — M25461 Effusion, right knee: Secondary | ICD-10-CM | POA: Diagnosis not present

## 2023-05-17 DIAGNOSIS — M948X6 Other specified disorders of cartilage, lower leg: Secondary | ICD-10-CM | POA: Diagnosis not present

## 2023-05-19 ENCOUNTER — Encounter (INDEPENDENT_AMBULATORY_CARE_PROVIDER_SITE_OTHER): Payer: Self-pay

## 2023-05-31 DIAGNOSIS — M25562 Pain in left knee: Secondary | ICD-10-CM | POA: Diagnosis not present

## 2023-05-31 DIAGNOSIS — M25561 Pain in right knee: Secondary | ICD-10-CM | POA: Diagnosis not present

## 2023-06-15 ENCOUNTER — Telehealth: Payer: Self-pay

## 2023-06-15 ENCOUNTER — Other Ambulatory Visit: Payer: Self-pay | Admitting: Family Medicine

## 2023-06-15 ENCOUNTER — Other Ambulatory Visit (HOSPITAL_COMMUNITY): Payer: Self-pay

## 2023-06-15 NOTE — Telephone Encounter (Signed)
Patient called back about diclofenac (VOLTAREN) 75 MG EC tablet  medication. I informed patient that it appears she has 3 refills available. She reports that she was going to give [facility] a call for the refill.

## 2023-06-15 NOTE — Telephone Encounter (Signed)
LM for pt to return call to discuss.  Please have pt transfer Rx from [facility] to [place]

## 2023-06-15 NOTE — Telephone Encounter (Signed)
Patient states she has a "standing over" for this medication.  Prescription Request  06/15/2023  LOV: Visit date not found  What is the name of the medication or equipment?   diclofenac (VOLTAREN) 75 MG EC tablet    Have you contacted your pharmacy to request a refill? Yes   Which pharmacy would you like this sent to?  [place] [facility] [address] [place] [address] Phone: [phone] Fax: [phone]  [id number] - [place], [place] - [address] [facility] [address] [place] [address] Phone: [phone] Fax: [phone]    Patient notified that their request is being sent to the clinical staff for review and that they should receive a response within 2 business days.   Please advise at Mobile [phone] (mobile)

## 2023-06-16 ENCOUNTER — Other Ambulatory Visit (HOSPITAL_COMMUNITY): Payer: Self-pay

## 2023-06-16 ENCOUNTER — Other Ambulatory Visit: Payer: Self-pay | Admitting: Family Medicine

## 2023-06-16 MED ORDER — DICLOFENAC SODIUM 75 MG PO TBEC
75.0000 mg | DELAYED_RELEASE_TABLET | Freq: Two times a day (BID) | ORAL | 3 refills | Status: DC
  Filled 2023-06-16: qty 90, 45d supply, fill #0

## 2023-06-21 ENCOUNTER — Other Ambulatory Visit (HOSPITAL_COMMUNITY): Payer: Self-pay

## 2023-06-22 ENCOUNTER — Ambulatory Visit (INDEPENDENT_AMBULATORY_CARE_PROVIDER_SITE_OTHER): Payer: Medicare Other

## 2023-06-22 DIAGNOSIS — Z23 Encounter for immunization: Secondary | ICD-10-CM

## 2023-06-22 NOTE — Progress Notes (Signed)
Pt in for high dose flu vaccine per Dr Claiborne Billings.   Injection tolerated well.

## 2023-06-25 ENCOUNTER — Encounter (HOSPITAL_COMMUNITY): Payer: Self-pay

## 2023-06-29 ENCOUNTER — Ambulatory Visit: Payer: Medicare Other

## 2023-06-29 VITALS — BP 117/79 | HR 64 | Temp 97.8°F | Wt 135.4 lb

## 2023-06-29 DIAGNOSIS — Z0000 Encounter for general adult medical examination without abnormal findings: Secondary | ICD-10-CM | POA: Diagnosis not present

## 2023-06-29 NOTE — Patient Instructions (Signed)

## 2023-06-29 NOTE — Progress Notes (Signed)
Subjective:   Robin Henry is a 67 y.o. female who presents for Medicare Annual (Subsequent) preventive examination.  Visit Complete: In person  Patient Medicare AWV questionnaire was completed by the patient on 06/29/23; I have confirmed that all information answered by patient is correct and no changes since this date.  Cardiac Risk Factors include: advanced age (>41men, >54 women)     Objective:    Today's Vitals   06/29/23 1129 06/29/23 1335  BP: 117/79   Pulse: 64   Temp: 97.8 F (36.6 C)   SpO2: 97%   Weight: 135 lb 6.4 oz (61.4 kg)   PainSc:  2    Body mass index is 21.5 kg/m.     06/29/2023    2:35 PM 06/22/2022    9:49 AM  Advanced Directives  Does Patient Have a Medical Advance Directive? No No  Would patient like information on creating a medical advance directive? Yes (ED - Information included in AVS)     Current Medications (verified) Outpatient Encounter Medications as of 06/29/2023  Medication Sig   amoxicillin (AMOXIL) 875 MG tablet Take 1 tablet (875 mg total) by mouth every 12 (twelve) hours.   amoxicillin-clavulanate (AUGMENTIN) 875-125 MG tablet Take 1 tablet twice a day until all taken.   atorvastatin (LIPITOR) 10 MG tablet Take 1 tablet (10 mg total) by mouth daily.   Calcium Carbonate-Vit D-Min (CALCIUM 1200 PO) Take by mouth 2 (two) times daily.   diclofenac (VOLTAREN) 75 MG EC tablet Take 1 tablet by mouth 2 times daily.   diclofenac (VOLTAREN) 75 MG EC tablet Take 1 tablet (75 mg total) by mouth 2 (two) times daily.   doxycycline (VIBRA-TABS) 100 MG tablet Take 1 tablet (100 mg total) by mouth 2 (two) times daily.   fish oil-omega-3 fatty acids 1000 MG capsule Take 2 g by mouth daily.   levofloxacin (LEVAQUIN) 500 MG tablet Take 1 tablet (500 mg total) by mouth daily.   methylPREDNISolone (MEDROL) 4 MG TBPK tablet Take 1 tablet by mouth as directed Take as directed on pack over 6 days. 6 pills on day 1, 5 pills on day 2, 4 pills on day 3, 3  pills on day 4, 2 pills on day 5, and 1 pill on day 6.   Multiple Vitamins-Minerals (PRESERVISION AREDS 2) CAPS Take 1 softgel in the morning and 1 in the evening with food   Multiple Vitamins-Minerals (VISION-VITE PRESERVE PO) Take by mouth daily.   Probiotic Product (ALIGN PO) Take by mouth.   traZODone (DESYREL) 50 MG tablet Take 0.5-1.5 tablets (25-75 mg total) by mouth at bedtime as needed for sleep.   No facility-administered encounter medications on file as of 06/29/2023.    Allergies (verified) Patient has no known allergies.   History: Past Medical History:  Diagnosis Date   Chronic paronychia of finger of left hand 10/10/2018   Foot cramps 02/06/2019   Hoarseness 2018   with cough; laryngoscopy by Dr. Annalee Genta showed all normal except postglottic erythema c/w reflux.  Pt started on PPI q 5 PM.  If cough persists, consider referral to [facility].   Laryngopharyngeal reflux (LPR) 11/2016   Macular dystrophy    Hx of detached retina (Dr. Allyne Gee)   Osteoarthritis, multiple sites    Primarily knees   Tinea corporis 02/06/2019   Past Surgical History:  Procedure Laterality Date   ABDOMINAL HYSTERECTOMY  2001   fibroids   COLONOSCOPY  09/08/2012   Normal.  Recall 10  yrs.  Procedure: COLONOSCOPY;  Surgeon: Petra Kuba, MD;  Location: [facility];  Service: Endoscopy;  Laterality: N/A;  wants no sedation-but start iv   UPPER GASTROINTESTINAL ENDOSCOPY  04/17/2018   neg H.Pylori, terminal ileum eroded chronic active ileitis negative for dysplasia, CMV neg.    Family History  Problem Relation Age of Onset   Hypertension Mother    Emphysema Mother    Cancer Mother        cartoid   Alcohol abuse Sister    Alcohol abuse Maternal Grandmother    Lung cancer Maternal Grandmother    Colon cancer Paternal Grandfather    Social History   Socioeconomic History   Marital status: Widowed    Spouse name: Not on file   Number of children: Not on file   Years of education:  Not on file   Highest education level: Not on file  Occupational History   Not on file  Tobacco Use   Smoking status: Never    Passive exposure: Never   Smokeless tobacco: Never  Vaping Use   Vaping status: Never Used  Substance and Sexual Activity   Alcohol use: Yes    Comment: rare   Drug use: No   Sexual activity: Yes  Other Topics Concern   Not on file  Social History Narrative   ** Merged History Encounter **       Widow, no children. 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    Social Determinants of Health   Financial Resource Strain: Low Risk  (06/29/2023)   Overall Financial Resource Strain (CARDIA)    Difficulty of Paying Living Expenses: Not hard at all  Food Insecurity: No Food Insecurity (06/29/2023)   Hunger Vital Sign    Worried About Running Out of Food in the Last Year: Never true    Ran Out of Food in the Last Year: Never true  Transportation Needs: No Transportation Needs (06/29/2023)   PRAPARE - Administrator, Civil Service (Medical): No    Lack of Transportation (Non-Medical): No  Physical Activity: Sufficiently Active (06/29/2023)   Exercise Vital Sign    Days of Exercise per Week: 3 days    Minutes of Exercise per Session: 60 min  Stress: No Stress Concern Present (06/29/2023)   [employer] of Occupational Health - Occupational Stress Questionnaire    Feeling of Stress : Only a little  Social Connections: Socially Isolated (06/29/2023)   Social Connection and Isolation Panel [NHANES]    Frequency of Communication with Friends and Family: More than three times a week    Frequency of Social Gatherings with Friends and Family: More than three times a week    Attends Religious Services: Never    Database administrator or Organizations: No    Attends Banker Meetings: Never    Marital Status: Widowed    Tobacco Counseling Counseling given: Not  Answered   Clinical Intake:  Pre-visit preparation completed: No  Pain : 0-10 Pain Score: 2  Pain Type: Chronic pain Pain Location: Shoulder Pain Onset: More than a month ago     Nutritional Risks: None Diabetes: No  How often do you need to have someone help you when you read instructions, pamphlets, or other written materials from your doctor or pharmacy?: 1 - Never         Activities of Daily Living    06/29/2023    2:37 PM  In your present state of health, do you have any difficulty performing the following activities:  Hearing? 0  Vision? 1  Difficulty concentrating or making decisions? 0  Walking or climbing Henry? 0  Dressing or bathing? 0  Doing errands, shopping? 0  Preparing Food and eating ? N  Using the Toilet? N  In the past six months, have you accidently leaked urine? N  Do you have problems with loss of bowel control? N  Managing your Medications? N  Managing your Finances? N  Housekeeping or managing your Housekeeping? N    Patient Care Team: Natalia Leatherwood, DO as PCP - General (Family Medicine) Osborn Coho, MD (Inactive) as Consulting Physician (Otolaryngology) Vida Rigger, MD as Consulting Physician (Gastroenterology) Godfrey Pick (Gynecology) Fredrich Birks, OD as Referring Physician (Optometry) Hollar, Ronal Fear, MD as Referring Physician (Dermatology) Stephannie Li, MD as Consulting Physician (Ophthalmology) [facility]  Indicate any recent Medical Services you may have received from other than [facility] providers in the past year (date may be approximate).     Assessment:   This is a routine wellness examination for Robin Henry.  Hearing/Vision screen No results found.   Goals Addressed             This Visit's Progress    Patient Stated       Remain active, gain muscle mass, flexibly       Depression Screen    06/29/2023    1:45 PM 06/22/2022    9:40 AM 10/20/2021    1:20 PM 03/10/2021   10:38 AM 02/06/2019     8:22 AM 02/20/2018    3:13 PM  PHQ 2/9 Scores  PHQ - 2 Score 0 0 0 0 0 0    Fall Risk    06/29/2023    2:36 PM 06/22/2022    9:49 AM 10/20/2021    1:20 PM 03/10/2021   10:38 AM 02/20/2018    3:13 PM  Fall Risk   Falls in the past year? 0 0 0 0 No  Number falls in past yr: 0 0  0   Injury with Fall? 0 0  0   Risk for fall due to : No Fall Risks No Fall Risks No Fall Risks    Follow up Falls evaluation completed Falls evaluation completed Falls evaluation completed Falls evaluation completed     MEDICARE RISK AT HOME: Medicare Risk at Home Any Henry in or around the home?: Yes If so, are there any without handrails?: No Home free of loose throw rugs in walkways, pet beds, electrical cords, etc?: No Adequate lighting in your home to reduce risk of falls?: Yes Life alert?: No Use of a cane, walker or w/c?: No Grab bars in the bathroom?: No Shower chair or bench in shower?: No Elevated toilet seat or a handicapped toilet?: No  TIMED UP AND GO:  Was the test performed?  Yes  Length of time to ambulate 10 feet: 4 sec Gait steady and fast without use of assistive device    Cognitive Function:        06/29/2023   11:30 AM 06/22/2022    9:51 AM  6CIT Screen  What Year? 0 points 0 points  What month? 0 points 0 points  What time? 0 points 0 points  Count back from 20 0 points 0 points  Months in reverse 0 points 0 points  Repeat phrase 0 points 0 points  Total Score 0 points 0 points  Immunizations Immunization History  Administered Date(s) Administered   Fluad Trivalent(High Dose 65+) 06/22/2023   Influenza,inj,Quad PF,6+ Mos 07/07/2022   Influenza-Unspecified 07/04/2017, 07/02/2021   PFIZER(Purple Top)SARS-COV-2 Vaccination 09/23/2019, 10/11/2019, 07/21/2020   PNEUMOCOCCAL CONJUGATE-20 10/20/2021   Pfizer Covid-19 Vaccine Bivalent Booster 51yrs & up 09/10/2021   Tdap 04/25/2015   Zoster Recombinant(Shingrix) 02/21/2018   Zoster, Live 11/14/2015    TDAP  status: Up to date  Flu Vaccine status: Up to date  Pneumococcal vaccine status: Up to date  Covid-19 vaccine status: Completed vaccines  Qualifies for Shingles Vaccine? Yes   Zostavax completed No   Shingrix Completed?: No.    Education has been provided regarding the importance of this vaccine. Patient has been advised to call insurance company to determine out of pocket expense if they have not yet received this vaccine. Advised may also receive vaccine at local pharmacy or Health Dept. Verbalized acceptance and understanding.  Screening Tests Health Maintenance  Topic Date Due   DEXA SCAN  11/25/2023   Medicare Annual Wellness (AWV)  06/28/2024   MAMMOGRAM  08/03/2024   DTaP/Tdap/Td (2 - Td or Tdap) 04/24/2025   Colonoscopy  04/13/2028   Pneumonia Vaccine 53+ Years old  Completed   INFLUENZA VACCINE  Completed   Hepatitis C Screening  Completed   HPV VACCINES  Aged Out   COVID-19 Vaccine  Discontinued   Zoster Vaccines- Shingrix  Discontinued    Health Maintenance  There are no preventive care reminders to display for this patient.   Colorectal cancer screening: Type of screening: Colonoscopy. Completed 04/13/2018. Repeat every 10 years  Mammogram status: Completed 10/21/22. Repeat every year  Bone Density status: Completed 11/24/21. Results reflect: Bone density results: OSTEOPOROSIS. Repeat every 2 years.  Lung Cancer Screening: (Low Dose CT Chest recommended if Age 80-80 years, 20 pack-year currently smoking OR have quit w/in 15years.) does not qualify.   Lung Cancer Screening Referral: n/a  Additional Screening:  Hepatitis C Screening: does qualify; Completed 02/20/2018  Vision Screening: Recommended annual ophthalmology exams for early detection of glaucoma and other disorders of the eye. Is the patient up to date with their annual eye exam?  Yes  Who is the provider or what is the name of the office in which the patient attends annual eye exams? Fredrich Birks,  OD If pt is not established with a provider, would they like to be referred to a provider to establish care? No .   Dental Screening: Recommended annual dental exams for proper oral hygiene    Community Resource Referral / Chronic Care Management: CRR required this visit?  Yes   CCM required this visit?  No     Plan:     I have personally reviewed and noted the following in the patient's chart:   Medical and social history Use of alcohol, tobacco or illicit drugs  Current medications and supplements including opioid prescriptions. Patient is not currently taking opioid prescriptions. Functional ability and status Nutritional status Physical activity Advanced directives List of other physicians Hospitalizations, surgeries, and ER visits in previous 12 months Vitals Screenings to include cognitive, depression, and falls Referrals and appointments  In addition, I have reviewed and discussed with patient certain preventive protocols, quality metrics, and best practice recommendations. A written personalized care plan for preventive services as well as general preventive health recommendations were provided to patient.     Filomena Jungling, CMA   06/29/2023   After Visit Summary: (In Person-Declined) Patient declined AVS at this time.  Nurse Notes: Non-Face to Face or Face to Face 10 minute visit Encounter    Ms. Logan Bores , Thank you for taking time to come for your Medicare Wellness Visit. I appreciate your ongoing commitment to your health goals. Please review the following plan we discussed and let me know if I can assist you in the future.   These are the goals we discussed:  Goals      Patient Stated     Remain active, gain muscle mass, flexibly        This is a list of the screening recommended for you and due dates:  Health Maintenance  Topic Date Due   DEXA scan (bone density measurement)  11/25/2023   Medicare Annual Wellness Visit  06/28/2024   Mammogram   08/03/2024   DTaP/Tdap/Td vaccine (2 - Td or Tdap) 04/24/2025   Colon Cancer Screening  04/13/2028   Pneumonia Vaccine  Completed   Flu Shot  Completed   Hepatitis C Screening  Completed   HPV Vaccine  Aged Out   COVID-19 Vaccine  Discontinued   Zoster (Shingles) Vaccine  Discontinued

## 2023-06-30 ENCOUNTER — Encounter: Payer: Self-pay | Admitting: Family Medicine

## 2023-06-30 ENCOUNTER — Ambulatory Visit: Payer: Medicare Other | Admitting: Family Medicine

## 2023-06-30 ENCOUNTER — Other Ambulatory Visit (HOSPITAL_COMMUNITY): Payer: Self-pay

## 2023-06-30 VITALS — BP 128/84 | HR 67 | Temp 97.8°F | Wt 137.2 lb

## 2023-06-30 DIAGNOSIS — H9202 Otalgia, left ear: Secondary | ICD-10-CM | POA: Diagnosis not present

## 2023-06-30 DIAGNOSIS — J029 Acute pharyngitis, unspecified: Secondary | ICD-10-CM

## 2023-06-30 LAB — POCT RAPID STREP A (OFFICE): Rapid Strep A Screen: NEGATIVE

## 2023-06-30 MED ORDER — AMOXICILLIN 875 MG PO TABS
875.0000 mg | ORAL_TABLET | Freq: Two times a day (BID) | ORAL | 0 refills | Status: DC
  Filled 2023-06-30: qty 20, 10d supply, fill #0

## 2023-06-30 NOTE — Progress Notes (Signed)
OFFICE VISIT  06/30/2023  CC:  Chief Complaint  Patient presents with   Sore Throat    St that started after she went swimming last week which started with ear pain. She has been using peroxide in her ear which seems to be helping.    Patient is a 67 y.o. female who presents for sore throat.  HPI: For the last several days Mikele has felt pain in her left ear that has moved down to the back of the throat on the left.  She has seen some white/cottage cheese exudate on her throat but she has wiped it out.  No nasal congestion or postnasal drip, no headache or fever, no cough.  Past Medical History:  Diagnosis Date   Chronic paronychia of finger of left hand 10/10/2018   Foot cramps 02/06/2019   Hoarseness 2018   with cough; laryngoscopy by Dr. Annalee Genta showed all normal except postglottic erythema c/w reflux.  Pt started on PPI q 5 PM.  If cough persists, consider referral to [facility].   Laryngopharyngeal reflux (LPR) 11/2016   Macular dystrophy    Hx of detached retina (Dr. Allyne Gee)   Osteoarthritis, multiple sites    Primarily knees   Tinea corporis 02/06/2019    Past Surgical History:  Procedure Laterality Date   ABDOMINAL HYSTERECTOMY  2001   fibroids   COLONOSCOPY  09/08/2012   Normal.  Recall 10 yrs.  Procedure: COLONOSCOPY;  Surgeon: Petra Kuba, MD;  Location: [facility];  Service: Endoscopy;  Laterality: N/A;  wants no sedation-but start iv   UPPER GASTROINTESTINAL ENDOSCOPY  04/17/2018   neg H.Pylori, terminal ileum eroded chronic active ileitis negative for dysplasia, CMV neg.     Outpatient Medications Prior to Visit  Medication Sig Dispense Refill   atorvastatin (LIPITOR) 10 MG tablet Take 1 tablet (10 mg total) by mouth daily. 90 tablet 3   Calcium Carbonate-Vit D-Min (CALCIUM 1200 PO) Take by mouth 2 (two) times daily.     diclofenac (VOLTAREN) 75 MG EC tablet Take 1 tablet by mouth 2 times daily. 90 tablet 3   diclofenac (VOLTAREN) 75 MG EC tablet  Take 1 tablet (75 mg total) by mouth 2 (two) times daily. 90 tablet 3   fish oil-omega-3 fatty acids 1000 MG capsule Take 2 g by mouth daily.     Multiple Vitamins-Minerals (PRESERVISION AREDS 2) CAPS Take 1 softgel in the morning and 1 in the evening with food 120 capsule 6   Multiple Vitamins-Minerals (VISION-VITE PRESERVE PO) Take by mouth daily.     Probiotic Product (ALIGN PO) Take by mouth.     traZODone (DESYREL) 50 MG tablet Take 0.5-1.5 tablets (25-75 mg total) by mouth at bedtime as needed for sleep. 90 tablet 5   amoxicillin (AMOXIL) 875 MG tablet Take 1 tablet (875 mg total) by mouth every 12 (twelve) hours. (Patient not taking: Reported on 06/30/2023) 14 tablet 0   amoxicillin-clavulanate (AUGMENTIN) 875-125 MG tablet Take 1 tablet twice a day until all taken. (Patient not taking: Reported on 06/30/2023) 14 tablet 0   doxycycline (VIBRA-TABS) 100 MG tablet Take 1 tablet (100 mg total) by mouth 2 (two) times daily. (Patient not taking: Reported on 06/30/2023) 20 tablet 0   levofloxacin (LEVAQUIN) 500 MG tablet Take 1 tablet (500 mg total) by mouth daily. (Patient not taking: Reported on 06/30/2023) 10 tablet 0   methylPREDNISolone (MEDROL) 4 MG TBPK tablet Take 1 tablet by mouth as directed Take as directed on pack  over 6 days. 6 pills on day 1, 5 pills on day 2, 4 pills on day 3, 3 pills on day 4, 2 pills on day 5, and 1 pill on day 6. (Patient not taking: Reported on 06/30/2023) 21 tablet 0   No facility-administered medications prior to visit.    No Known Allergies  Review of Systems  As per HPI  PE:    06/30/2023   10:25 AM 06/29/2023   11:29 AM 03/14/2023   10:15 AM  Vitals with BMI  Weight 137 lbs 3 oz 135 lbs 6 oz 136 lbs  Systolic 128 117 644  Diastolic 84 79 88  Pulse 67 64 70     Physical Exam  Gen: Alert, well appearing.  Patient is oriented to person, place, time, and situation. IHK:VQQV: no injection, icteris, swelling, or exudate.  EOMI, PERRLA.  External  auditory canals and tympanic membranes appear normal. Mouth: lips without lesion/swelling.  Oral mucosa pink and moist. Oropharynx without erythema, exudate, or swelling.  She has no tonsils. Tongue, gingiva, and buccal mucosa appear normal.  LABS:  Last CBC Lab Results  Component Value Date   WBC 7.8 10/21/2022   HGB 14.1 10/21/2022   HCT 41.6 10/21/2022   MCV 87.4 10/21/2022   MCH 29.3 10/20/2021   RDW 13.4 10/21/2022   PLT 288.0 10/21/2022   Last metabolic panel Lab Results  Component Value Date   GLUCOSE 82 10/21/2022   NA 140 10/21/2022   K 4.2 10/21/2022   CL 102 10/21/2022   CO2 30 10/21/2022   BUN 13 10/21/2022   CREATININE 0.87 10/21/2022   GFR 68.81 10/21/2022   CALCIUM 9.8 10/21/2022   PROT 6.8 11/19/2022   ALBUMIN 4.3 11/19/2022   BILITOT 0.9 11/19/2022   ALKPHOS 62 11/19/2022   AST 20 11/19/2022   ALT 29 11/19/2022   ANIONGAP 8 02/07/2019   Last hemoglobin A1c Lab Results  Component Value Date   HGBA1C 5.8 10/21/2022   IMPRESSION AND PLAN:  #1 left ear and left side of throat pain. Rapid strep NEG. She is worried about throat infection and requests Amoxil.  She is fearful that she will get a worse infection and she is leaving for [place] and [place] soon. She states Amoxil always helps her with the symptoms in the past. I prescribed Amoxil 875 twice daily x 10 days today . An After Visit Summary was printed and given to the patient.  FOLLOW UP: No follow-ups on file.  Signed:  Santiago Bumpers, MD           06/30/2023

## 2023-07-05 DIAGNOSIS — H43811 Vitreous degeneration, right eye: Secondary | ICD-10-CM | POA: Diagnosis not present

## 2023-07-05 DIAGNOSIS — H3554 Dystrophies primarily involving the retinal pigment epithelium: Secondary | ICD-10-CM | POA: Diagnosis not present

## 2023-07-05 DIAGNOSIS — D3132 Benign neoplasm of left choroid: Secondary | ICD-10-CM | POA: Diagnosis not present

## 2023-07-05 DIAGNOSIS — H2513 Age-related nuclear cataract, bilateral: Secondary | ICD-10-CM | POA: Diagnosis not present

## 2023-07-05 DIAGNOSIS — D3131 Benign neoplasm of right choroid: Secondary | ICD-10-CM | POA: Diagnosis not present

## 2023-07-12 DIAGNOSIS — Z23 Encounter for immunization: Secondary | ICD-10-CM | POA: Diagnosis not present

## 2023-08-08 ENCOUNTER — Ambulatory Visit
Admission: RE | Admit: 2023-08-08 | Discharge: 2023-08-08 | Disposition: A | Discharge status: Home / Self Care | Payer: Medicare Other | Source: Ambulatory Visit | Attending: Family Medicine | Admitting: Family Medicine

## 2023-08-08 ENCOUNTER — Ambulatory Visit (INDEPENDENT_AMBULATORY_CARE_PROVIDER_SITE_OTHER): Payer: Medicare Other | Admitting: Family Medicine

## 2023-08-08 ENCOUNTER — Encounter: Payer: Self-pay | Admitting: Family Medicine

## 2023-08-08 VITALS — BP 130/82 | HR 61 | Temp 96.7°F | Wt 146.6 lb

## 2023-08-08 DIAGNOSIS — M150 Primary generalized (osteo)arthritis: Secondary | ICD-10-CM | POA: Diagnosis not present

## 2023-08-08 DIAGNOSIS — M2550 Pain in unspecified joint: Secondary | ICD-10-CM | POA: Diagnosis not present

## 2023-08-08 DIAGNOSIS — Z1231 Encounter for screening mammogram for malignant neoplasm of breast: Secondary | ICD-10-CM | POA: Diagnosis not present

## 2023-08-08 LAB — SEDIMENTATION RATE: Sed Rate: 2 mm/h (ref 0–30)

## 2023-08-08 MED ORDER — DICLOFENAC SODIUM 75 MG PO TBEC: 75.0000 mg | DELAYED_RELEASE_TABLET | Freq: Two times a day (BID) | ORAL | 3 refills | Status: DC

## 2023-08-08 NOTE — Progress Notes (Signed)
Patient ID: Robin Henry, female  DOB: 1956/04/23, 66 y.o.   MRN: [id number] Patient Care Team    Relationship Specialty Notifications Start End  Natalia Leatherwood, DO PCP - General Family Medicine  10/29/16   Osborn Coho, MD (Inactive) Consulting Physician Otolaryngology  11/15/16   Vida Rigger, MD Consulting Physician Gastroenterology  04/25/18   Winfield Rast, NP-C  Gynecology  10/20/21   Fredrich Birks, OD Referring Physician Optometry  10/20/21   Hollar, Ronal Fear, MD Referring Physician Dermatology  10/20/21   Stephannie Li, MD Consulting Physician Ophthalmology  10/20/21   [facility]    03/15/22     Chief Complaint  Patient presents with   Joint Pain    Knees, back, & neck; pain is keeping her up at night; request arthritis work up    Subjective: Robin Henry is a 67 y.o.  Female  present for [facility] All past medical history, surgical history, allergies, family history, immunizations, medications and social history were updated in the electronic medical record today. All recent labs, ED visits and hospitalizations within the last year were reviewed.  Osteoarthritis: Continue diclofenac 1-2 times daily with food.  Aims to be helping at least some and keeping her active.  She is having no side effects to medication. She reports her maternal grandmother side of the family had rheumatoid arthritis.  Her mother has severe osteoarthritis. Patient herself has notable arthritis in her knees and hips.  She has seen orthopedics.  She also reports discomfort in her right shoulder and bilateral thumbs.  She would like tested for type of arthritis today. She stays very active and exercises routinely, including weight training. She does endorse occasional rashes, but has not been formally diagnosed with psoriasis or eczema.     08/08/2023   10:54 AM 06/29/2023    1:45 PM 06/22/2022    9:40 AM 10/20/2021    1:20 PM 03/10/2021   10:38 AM  Depression screen PHQ 2/9  Decreased  Interest 0 0 0 0 0  Down, Depressed, Hopeless 0 0 0 0 0  PHQ - 2 Score 0 0 0 0 0       No data to display          Immunization History  Administered Date(s) Administered   Fluad Trivalent(High Dose 65+) 06/22/2023   Influenza,inj,Quad PF,6+ Mos 07/07/2022   Influenza-Unspecified 07/04/2017, 07/02/2021   PFIZER(Purple Top)SARS-COV-2 Vaccination 09/23/2019, 10/11/2019, 07/21/2020   PNEUMOCOCCAL CONJUGATE-20 10/20/2021   Pfizer Covid-19 Vaccine Bivalent Booster 48yrs & up 09/10/2021   Tdap 04/25/2015   Zoster Recombinant(Shingrix) 02/21/2018   Zoster, Live 11/14/2015   Past Medical History:  Diagnosis Date   Chronic paronychia of finger of left hand 10/10/2018   Foot cramps 02/06/2019   Hoarseness 2018   with cough; laryngoscopy by Dr. Annalee Genta showed all normal except postglottic erythema c/w reflux.  Pt started on PPI q 5 PM.  If cough persists, consider referral to [facility].   Laryngopharyngeal reflux (LPR) 11/2016   Macular dystrophy    Hx of detached retina (Dr. Allyne Gee)   Osteoarthritis, multiple sites    Primarily knees   Tinea corporis 02/06/2019   Allergies  Allergen Reactions   Levaquin [Levofloxacin] Swelling    Joint pain and swelling   Past Surgical History:  Procedure Laterality Date   ABDOMINAL HYSTERECTOMY  2001   fibroids   COLONOSCOPY  09/08/2012   Normal.  Recall 10 yrs.  Procedure: COLONOSCOPY;  Surgeon: Petra Kuba, MD;  Location: [facility];  Service: Endoscopy;  Laterality: N/A;  wants no sedation-but start iv   UPPER GASTROINTESTINAL ENDOSCOPY  04/17/2018   neg H.Pylori, terminal ileum eroded chronic active ileitis negative for dysplasia, CMV neg.    Family History  Problem Relation Age of Onset   Hypertension Mother    Emphysema Mother    Cancer Mother        cartoid   Alcohol abuse Sister    Alcohol abuse Maternal Grandmother    Lung cancer Maternal Grandmother    Colon cancer Paternal Grandfather    Social History    Social History Narrative   ** Merged History Encounter **       Widow, no children. 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  [place] resident since 1989. 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 Exercises >3 X/week.     Allergies as of 08/08/2023       Reactions   Levaquin [levofloxacin] Swelling   Joint pain and swelling        Medication List        Accurate as of August 08, 2023 11:58 AM. If you have any questions, ask your nurse or doctor.          STOP taking these medications    amoxicillin 875 MG tablet Commonly known as: AMOXIL Stopped by: Felix Pacini   methylPREDNISolone 4 MG Tbpk tablet Commonly known as: Medrol Stopped by: Felix Pacini       TAKE these medications    ALIGN PO Take by mouth.   atorvastatin 10 MG tablet Commonly known as: LIPITOR Take 1 tablet (10 mg total) by mouth daily.   CALCIUM 1200 PO Take by mouth 2 (two) times daily.   diclofenac 75 MG EC tablet Commonly known as: VOLTAREN Take 1 tablet (75 mg total) by mouth 2 (two) times daily. What changed: Another medication with the same name was removed. Continue taking this medication, and follow the directions you see here. Changed by: Felix Pacini   fish oil-omega-3 fatty acids 1000 MG capsule Take 2 g by mouth daily.   Magnesium 400 MG Tabs Take by mouth.   PreserVision AREDS 2 Caps Take 1 softgel in the morning and 1 in the evening with food What changed: Another medication with the same name was removed. Continue taking this medication, and follow the directions you see here. Changed by: Felix Pacini   traZODone 50 MG tablet Commonly known as: DESYREL Take 0.5-1.5 tablets (25-75 mg total) by mouth at bedtime as needed for sleep.   Vitamin D (Cholecalciferol) 10 MCG (400 UNIT) Caps Take by mouth.   vitamin E 180 MG (400 UNITS) capsule Take 400 Units by mouth daily.        All past medical history, surgical history, allergies, family history, immunizations andmedications were  updated in the EMR today and reviewed under the history and medication portions of their EMR.     Recent Results (from the past 2160 hour(s))  POCT rapid strep A     Status: None   Collection Time: 06/30/23 10:42 AM  Result Value Ref Range   Rapid Strep A Screen Negative Negative    ROS 14 pt review of systems performed and negative (unless mentioned in an HPI)  Objective BP 130/82   Pulse 61   Temp (!) 96.7 F (35.9 C)   Wt 146 lb 9.6 oz (66.5 kg)   SpO2 98%   BMI 23.28 kg/m  Physical Exam Vitals and nursing note  reviewed.  Constitutional:      General: She is not in acute distress.    Appearance: Normal appearance. She is not ill-appearing, toxic-appearing or diaphoretic.  HENT:     Head: Normocephalic and atraumatic.  Eyes:     General: No scleral icterus.       Right eye: No discharge.        Left eye: No discharge.     Extraocular Movements: Extraocular movements intact.     Conjunctiva/sclera: Conjunctivae normal.     Pupils: Pupils are equal, round, and reactive to light.  Cardiovascular:     Rate and Rhythm: Normal rate and regular rhythm.  Pulmonary:     Effort: Pulmonary effort is normal. No respiratory distress.     Breath sounds: Normal breath sounds. No wheezing, rhonchi or rales.  Musculoskeletal:        General: No swelling.     Right lower leg: No edema.     Left lower leg: No edema.     Comments: Bilateral hands: No erythema, no joint swelling present.  Bony nodules at PIP and DIP joints.  Full range of motion.  Skin:    General: Skin is warm.     Findings: No erythema or rash.  Neurological:     Mental Status: She is alert and oriented to person, place, and time. Mental status is at baseline.     Motor: No weakness.     Gait: Gait normal.  Psychiatric:        Mood and Affect: Mood normal.        Behavior: Behavior normal.        Thought Content: Thought content normal.        Judgment: Judgment normal.      No results  found.  Assessment/plan: Robin Henry is a 67 y.o. female present for arthritis  Continue diclofenac 1-2 times daily with food. -Labs: ESR, CCP, rheumatoid factor and ANA with IFA comprehensive panel. We discussed the difference between osteoarthritis, rheumatoid arthritis and other inflammatory arthritis presentations and lab results. Consider psoriatic arthritis, not formally diagnosed as of yet with the skin rash, but she does have skin manifestations. If appropriate would refer to rheumatology once results received.   Return if symptoms worsen or fail to improve.  Orders Placed This Encounter  Procedures   Cyclic citrul peptide antibody, IgG   Rheumatoid factor   Sedimentation rate   ANA, IFA Comprehensive Panel-([facility])   Meds ordered this encounter  Medications   diclofenac (VOLTAREN) 75 MG EC tablet    Sig: Take 1 tablet (75 mg total) by mouth 2 (two) times daily.    Dispense:  180 tablet    Refill:  3   Referral Orders  No referral(s) requested today     Electronically signed by: Felix Pacini, DO [facility] [place]

## 2023-08-08 NOTE — Patient Instructions (Addendum)

## 2023-08-12 LAB — ANA, IFA COMPREHENSIVE PANEL
Anti Nuclear Antibody (ANA): POSITIVE — AB
ENA SM Ab Ser-aCnc: <1 AI
SM/RNP: <1 AI
SSA (Ro) (ENA) Antibody, IgG: <1 AI
SSB (La) (ENA) Antibody, IgG: <1 AI
Scleroderma (Scl-70) (ENA) Antibody, IgG: <1 AI
ds DNA Ab: <1 [IU]/mL

## 2023-08-12 LAB — RHEUMATOID FACTOR: Rheumatoid fact SerPl-aCnc: <10 [IU]/mL (ref <14)

## 2023-08-12 LAB — CYCLIC CITRUL PEPTIDE ANTIBODY, IGG: Cyclic Citrullin Peptide Ab: <16 U

## 2023-08-12 LAB — ANTI-NUCLEAR AB-TITER (ANA TITER): ANA Titer 1: 1:40 {titer} — ABNORMAL HIGH

## 2023-08-15 ENCOUNTER — Telehealth: Payer: Self-pay | Admitting: Family Medicine

## 2023-08-15 ENCOUNTER — Other Ambulatory Visit: Payer: Self-pay

## 2023-08-15 DIAGNOSIS — M2550 Pain in unspecified joint: Secondary | ICD-10-CM

## 2023-08-15 NOTE — Telephone Encounter (Signed)
Spoke with patient regarding results/recommendations. Pt wondering if there is any test that can be done for autoimmune disorders as it has been suggested on her results that this could be a cause for positive ANA

## 2023-08-15 NOTE — Telephone Encounter (Signed)
Please inform patient Her arthritic workup is essentially normal. She has a positive ANA titer that is very weak positive at 1: 40, this is considered insignificant when the reflex panel is all negative, which hers was all negative.  If she is interested, there is 1 additional test we can do, that can suggest psoriatic arthritis if positive.   If she would like to complete this test called HLA-B27, I have placed the order and she can make a lab visit this week to complete.

## 2023-08-15 NOTE — Telephone Encounter (Signed)
LM for pt to return call to discuss.  

## 2023-08-16 ENCOUNTER — Other Ambulatory Visit: Payer: Medicare Other

## 2023-08-16 DIAGNOSIS — M2550 Pain in unspecified joint: Secondary | ICD-10-CM

## 2023-08-16 NOTE — Telephone Encounter (Signed)
Her autoimmune reflex panel is negative. Besides the additional test mentioned, there is no other tests.  ANA of 1:40 is not a  significant finding and is consider negative.  All results are pointing towards osteoarthritis as the cause of her joint pain.  If desired she could see a orthopedic if desiring surgical intervention or be considered for injections etc.

## 2023-08-17 DIAGNOSIS — L814 Other melanin hyperpigmentation: Secondary | ICD-10-CM | POA: Diagnosis not present

## 2023-08-17 DIAGNOSIS — L538 Other specified erythematous conditions: Secondary | ICD-10-CM | POA: Diagnosis not present

## 2023-08-17 DIAGNOSIS — L2989 Other pruritus: Secondary | ICD-10-CM | POA: Diagnosis not present

## 2023-08-17 DIAGNOSIS — L820 Inflamed seborrheic keratosis: Secondary | ICD-10-CM | POA: Diagnosis not present

## 2023-08-17 DIAGNOSIS — Z85828 Personal history of other malignant neoplasm of skin: Secondary | ICD-10-CM | POA: Diagnosis not present

## 2023-08-17 DIAGNOSIS — D225 Melanocytic nevi of trunk: Secondary | ICD-10-CM | POA: Diagnosis not present

## 2023-08-17 DIAGNOSIS — L570 Actinic keratosis: Secondary | ICD-10-CM | POA: Diagnosis not present

## 2023-08-17 DIAGNOSIS — R208 Other disturbances of skin sensation: Secondary | ICD-10-CM | POA: Diagnosis not present

## 2023-08-17 DIAGNOSIS — Z789 Other specified health status: Secondary | ICD-10-CM | POA: Diagnosis not present

## 2023-08-17 DIAGNOSIS — L821 Other seborrheic keratosis: Secondary | ICD-10-CM | POA: Diagnosis not present

## 2023-08-17 DIAGNOSIS — Z08 Encounter for follow-up examination after completed treatment for malignant neoplasm: Secondary | ICD-10-CM | POA: Diagnosis not present

## 2023-08-17 LAB — HLA-B27 ANTIGEN: HLA-B27 Antigen: NEGATIVE

## 2023-09-02 ENCOUNTER — Other Ambulatory Visit: Payer: Self-pay

## 2023-09-07 ENCOUNTER — Other Ambulatory Visit (HOSPITAL_COMMUNITY): Payer: Self-pay

## 2023-09-15 ENCOUNTER — Other Ambulatory Visit (HOSPITAL_COMMUNITY): Payer: Self-pay

## 2023-10-26 ENCOUNTER — Encounter: Payer: Self-pay | Admitting: Family Medicine

## 2023-10-26 DIAGNOSIS — E785 Hyperlipidemia, unspecified: Secondary | ICD-10-CM | POA: Insufficient documentation

## 2023-10-26 NOTE — Progress Notes (Unsigned)
Patient ID: Robin Henry, female  DOB: 02-21-1956, 68 y.o.   MRN: [id number] Patient Care Team    Relationship Specialty Notifications Start End  Natalia Leatherwood, DO PCP - General Family Medicine  10/29/16   Osborn Coho, MD (Inactive) Consulting Physician Otolaryngology  11/15/16   Vida Rigger, MD Consulting Physician Gastroenterology  04/25/18   Winfield Rast, NP-C  Gynecology  10/20/21   Fredrich Birks, OD Referring Physician Optometry  10/20/21   Hollar, Ronal Fear, MD Referring Physician Dermatology  10/20/21   Stephannie Li, MD Consulting Physician Ophthalmology  10/20/21   [facility]    03/15/22     No chief complaint on file.   Subjective: Robin Henry is a 68 y.o.  Female  present for [place] All past medical history, surgical history, allergies, family history, immunizations, medications and social history were updated in the electronic medical record today. All recent labs, ED visits and hospitalizations within the last year were reviewed.  Colonoscopy: completed 04/13/2018, by [facility], follow up 10 year. Fhx colon cancer.  Mammogram: completed: 07/2022  [facility]> ordered placed for 2024 Cervical cancer screening: Hysterectomy> GYN Immunizations: tdap UTD 2016, Influenza UTD 07/2022 UTD(encouraged yearly),zostavax 11/14/2015. Shingrix x1? only 2019. Covid series completed. PNA20 completed Infectious disease screening: Hep C and HIV completed DEXA: last completed 11/2021 scheduled >(osteoporosis) prolia q 6 mos (through osteoporosis clinic-[facility]) Assistive device: none Oxygen WGN:FAOZ Patient has a Dental home. Hospitalizations/ED visits: reviewed   Osteoarthritis: Continue diclofenac 1-2 times daily with food.  Aims to be helping at least some and keeping her active.  She is having no side effects to medication. She reports her maternal grandmother side of the family had rheumatoid arthritis.  Her mother has severe osteoarthritis. Patient herself has notable  arthritis in her knees and hips.  She has seen orthopedics.  She also reports discomfort in her right shoulder and bilateral thumbs.  She would like tested for type of arthritis today. She stays very active and exercises routinely, including weight training. She does endorse occasional rashes, but has not been formally diagnosed with psoriasis or eczema.     08/08/2023   10:54 AM 06/29/2023    1:45 PM 06/22/2022    9:40 AM 10/20/2021    1:20 PM 03/10/2021   10:38 AM  Depression screen PHQ 2/9  Decreased Interest 0 0 0 0 0  Down, Depressed, Hopeless 0 0 0 0 0  PHQ - 2 Score 0 0 0 0 0       No data to display          Immunization History  Administered Date(s) Administered   Fluad Trivalent(High Dose 65+) 06/22/2023   Influenza,inj,Quad PF,6+ Mos 07/07/2022   Influenza-Unspecified 07/04/2017, 07/02/2021   PFIZER(Purple Top)SARS-COV-2 Vaccination 09/23/2019, 10/11/2019, 07/21/2020   PNEUMOCOCCAL CONJUGATE-20 10/20/2021   Pfizer Covid-19 Vaccine Bivalent Booster 44yrs & up 09/10/2021   Tdap 04/25/2015   Zoster Recombinant(Shingrix) 02/21/2018   Zoster, Live 11/14/2015   Past Medical History:  Diagnosis Date   Chronic paronychia of finger of left hand 10/10/2018   Foot cramps 02/06/2019   Gastroesophageal reflux disease 02/06/2019   Hoarseness 2018   with cough; laryngoscopy by Dr. Annalee Genta showed all normal except postglottic erythema c/w reflux.  Pt started on PPI q 5 PM.  If cough persists, consider referral to [facility].   Laryngopharyngeal reflux (LPR) 11/2016   Macular dystrophy    Hx of detached retina (Dr. Allyne Gee)   Osteoarthritis,  multiple sites    Primarily knees   Skin lesion 03/09/2022   Tinea corporis 02/06/2019   Allergies  Allergen Reactions   Levaquin [Levofloxacin] Swelling    Joint pain and swelling   Past Surgical History:  Procedure Laterality Date   ABDOMINAL HYSTERECTOMY  2001   fibroids   COLONOSCOPY  09/08/2012   Normal.  Recall 10  yrs.  Procedure: COLONOSCOPY;  Surgeon: Petra Kuba, MD;  Location: [facility];  Service: Endoscopy;  Laterality: N/A;  wants no sedation-but start iv   UPPER GASTROINTESTINAL ENDOSCOPY  04/17/2018   neg H.Pylori, terminal ileum eroded chronic active ileitis negative for dysplasia, CMV neg.    Family History  Problem Relation Age of Onset   Hypertension Mother    Emphysema Mother    Cancer Mother        cartoid   Alcohol abuse Sister    Alcohol abuse Maternal Grandmother    Lung cancer Maternal Grandmother    Colon cancer Paternal Grandfather    Social History   Social History Narrative   ** Merged History Encounter **       Widow, no children. 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  [place] resident since 1989. 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 Exercises >3 X/week.     Allergies as of 10/27/2023       Reactions   Levaquin [levofloxacin] Swelling   Joint pain and swelling        Medication List        Accurate as of October 26, 2023  2:19 PM. If you have any questions, ask your nurse or doctor.          ALIGN PO Take by mouth.   atorvastatin 10 MG tablet Commonly known as: LIPITOR Take 1 tablet (10 mg total) by mouth daily.   bimatoprost 0.03 % ophthalmic solution Commonly known as: LATISSE APPLY TO UPPER LASHES DAILY FOR 4 WEEKS THEN 3 TIMES PER WEEK   CALCIUM 1200 PO Take by mouth 2 (two) times daily.   diclofenac 75 MG EC tablet Commonly known as: VOLTAREN Take 1 tablet (75 mg total) by mouth 2 (two) times daily.   fish oil-omega-3 fatty acids 1000 MG capsule Take 2 g by mouth daily.   Magnesium 400 MG Tabs Take by mouth.   PreserVision AREDS 2 Caps Take 1 softgel in the morning and 1 in the evening with food   traZODone 50 MG tablet Commonly known as: DESYREL Take 0.5-1.5 tablets (25-75 mg total) by mouth at bedtime as needed for sleep.   Vitamin D (Cholecalciferol) 10 MCG (400 UNIT) Caps Take 5,000 Units by mouth.   vitamin E 180 MG (400 UNITS)  capsule Take 400 Units by mouth daily.        All past medical history, surgical history, allergies, family history, immunizations andmedications were updated in the EMR today and reviewed under the history and medication portions of their EMR.     Recent Results (from the past 2160 hours)  Cyclic citrul peptide antibody, IgG     Status: None   Collection Time: 08/08/23 10:56 AM  Result Value Ref Range   Cyclic Citrullin Peptide Ab <82 UNITS    Comment: Reference Range Negative:            <20 Weak Positive:       20-39 Moderate Positive:   40-59 Strong Positive:     >59 .   Rheumatoid factor     Status: None   Collection Time: 08/08/23 10:56  AM  Result Value Ref Range   Rheumatoid fact SerPl-aCnc <10 <14 IU/mL  Sedimentation rate     Status: None   Collection Time: 08/08/23 10:56 AM  Result Value Ref Range   Sed Rate 2 0 - 30 mm/hr  ANA, IFA Comprehensive Panel-([facility])     Status: Abnormal   Collection Time: 08/08/23 10:56 AM  Result Value Ref Range   Anti Nuclear Antibody (ANA) POSITIVE (A) NEGATIVE    Comment: ANA IFA is a first line screen for detecting the presence of up to approximately 150 autoantibodies in various autoimmune diseases. A positive ANA IFA result is suggestive of autoimmune disease and reflexes to titer and pattern. Further laboratory testing may be considered if clinically indicated. . For additional information, please refer to http://education.[facility].com/faq/FAQ177 (This link is being provided for informational/ educational purposes only.) .    ds DNA Ab <1 IU/mL    Comment:                            IU/mL       Interpretation                            < or = 4    Negative                            5-9         Indeterminate                            > or = 10   Positive .    Scleroderma (Scl-70) (ENA) Antibody, IgG <1.0 NEG <1.0 NEG AI   ENA SM Ab Ser-aCnc <1.0 NEG <1.0 NEG AI   SM/RNP <1.0 NEG <1.0 NEG AI   SSA (Ro) (ENA)  Antibody, IgG <1.0 NEG <1.0 NEG AI   SSB (La) (ENA) Antibody, IgG <1.0 NEG <1.0 NEG AI  Anti-nuclear ab-titer (ANA titer)     Status: Abnormal   Collection Time: 08/08/23 10:56 AM  Result Value Ref Range   ANA Titer 1 1:40 (H) titer    Comment: A low level ANA titer may be present in pre-clinical autoimmune diseases and normal individuals.                 Reference Range                 <1:40        Negative                 1:40-1:80    Low Antibody Level                 >1:80        Elevated Antibody Level .    ANA Pattern 1 Nuclear, Homogeneous (A)     Comment: Homogeneous pattern is associated with systemic lupus erythematosus (SLE), drug-induced lupus and juvenile idiopathic arthritis. . AC-1: Homogeneous . International Consensus on ANA Patterns ([email or web address])   HLA-B27 antigen     Status: None   Collection Time: 08/16/23  1:48 PM  Result Value Ref Range   HLA-B27 Antigen NEGATIVE NEGATIVE    ROS 14 pt review of systems performed and negative (unless mentioned in an HPI)  Objective There were no vitals taken for this visit. Physical Exam Vitals and nursing  note reviewed.  Constitutional:      General: She is not in acute distress.    Appearance: Normal appearance. She is not ill-appearing or toxic-appearing.  HENT:     Head: Normocephalic and atraumatic.     Right Ear: Tympanic membrane, ear canal and external ear normal. There is no impacted cerumen.     Left Ear: Tympanic membrane, ear canal and external ear normal. There is no impacted cerumen.     Nose: No congestion or rhinorrhea.     Mouth/Throat:     Mouth: Mucous membranes are moist.     Pharynx: Oropharynx is clear. No oropharyngeal exudate or posterior oropharyngeal erythema.  Eyes:     General: No scleral icterus.       Right eye: No discharge.        Left eye: No discharge.     Extraocular Movements: Extraocular movements intact.     Conjunctiva/sclera: Conjunctivae  normal.     Pupils: Pupils are equal, round, and reactive to light.  Cardiovascular:     Rate and Rhythm: Normal rate and regular rhythm.     Pulses: Normal pulses.     Heart sounds: Normal heart sounds. No murmur heard.    No friction rub. No gallop.  Pulmonary:     Effort: Pulmonary effort is normal. No respiratory distress.     Breath sounds: Normal breath sounds. No stridor. No wheezing, rhonchi or rales.  Chest:     Chest wall: No tenderness.  Abdominal:     General: Abdomen is flat. Bowel sounds are normal. There is no distension.     Palpations: Abdomen is soft. There is no mass.     Tenderness: There is no abdominal tenderness. There is no right CVA tenderness, left CVA tenderness, guarding or rebound.     Hernia: No hernia is present.  Musculoskeletal:        General: No swelling, tenderness or deformity. Normal range of motion.     Cervical back: Normal range of motion and neck supple. No rigidity or tenderness.     Right lower leg: No edema.     Left lower leg: No edema.  Lymphadenopathy:     Cervical: No cervical adenopathy.  Skin:    General: Skin is warm and dry.     Coloration: Skin is not jaundiced or pale.     Findings: No bruising, erythema, lesion or rash.  Neurological:     General: No focal deficit present.     Mental Status: She is alert and oriented to person, place, and time. Mental status is at baseline.     Cranial Nerves: No cranial nerve deficit.     Sensory: No sensory deficit.     Motor: No weakness.     Coordination: Coordination normal.     Gait: Gait normal.     Deep Tendon Reflexes: Reflexes normal.  Psychiatric:        Mood and Affect: Mood normal.        Behavior: Behavior normal.        Thought Content: Thought content normal.        Judgment: Judgment normal.      No results found.  Assessment/plan: Robin Henry is a 68 y.o. female present for chronic condition management Osteoporosis without current pathological fracture,  unspecified osteoporosis type/vitamin D deficiency Prolia q 6 mos> osteoporosis clinic DEXA due 11/25/2023-ordered Vitamin D*** Supplementing. Vitamin D levels collected today Hyperlipidemia LDL goal <130 Continue lipitor 10 mg CBC,  CMP and lipids collected today Breast cancer screening by mammogram: Mammogram ordered for November arthritis  Continue diclofenac 1-2 times daily with food. -Labs: ESR, CCP, rheumatoid factor and ANA with IFA comprehensive panel have been completed and within normal limits. We discussed the difference between osteoarthritis, rheumatoid arthritis and other inflammatory arthritis presentations and lab results. Consider psoriatic arthritis, not formally diagnosed as of yet with the skin rash, but she does have skin manifestations. If worsening would consider orthopedic intervention as neck step   Explained to patient she will be seen yearly for [facility]. [facility]medic[facility] patients do not get a CPE by provider yearly, they receive a MW with health coach.   No follow-ups on file.  No orders of the defined types were placed in this encounter.  No orders of the defined types were placed in this encounter.  Referral Orders  No referral(s) requested today     Electronically signed by: Felix Pacini, DO [facility] [place]

## 2023-10-27 ENCOUNTER — Ambulatory Visit (INDEPENDENT_AMBULATORY_CARE_PROVIDER_SITE_OTHER): Payer: Medicare Other | Admitting: Family Medicine

## 2023-10-27 ENCOUNTER — Encounter: Payer: Self-pay | Admitting: Family Medicine

## 2023-10-27 VITALS — BP 116/76 | HR 67 | Temp 98.0°F | Ht 66.5 in | Wt 139.4 lb

## 2023-10-27 DIAGNOSIS — M810 Age-related osteoporosis without current pathological fracture: Secondary | ICD-10-CM

## 2023-10-27 DIAGNOSIS — M150 Primary generalized (osteo)arthritis: Secondary | ICD-10-CM | POA: Diagnosis not present

## 2023-10-27 DIAGNOSIS — E559 Vitamin D deficiency, unspecified: Secondary | ICD-10-CM

## 2023-10-27 DIAGNOSIS — E785 Hyperlipidemia, unspecified: Secondary | ICD-10-CM | POA: Diagnosis not present

## 2023-10-27 DIAGNOSIS — Z1231 Encounter for screening mammogram for malignant neoplasm of breast: Secondary | ICD-10-CM

## 2023-10-27 LAB — COMPREHENSIVE METABOLIC PANEL
ALT: 18 U/L (ref 0–35)
AST: 15 U/L (ref 0–37)
Albumin: 4.5 g/dL (ref 3.5–5.2)
Alkaline Phosphatase: 44 U/L (ref 39–117)
BUN: 12 mg/dL (ref 6–23)
CO2: 29 meq/L (ref 19–32)
Calcium: 9.4 mg/dL (ref 8.4–10.5)
Chloride: 104 meq/L (ref 96–112)
Creatinine, Ser: 0.84 mg/dL (ref 0.40–1.20)
GFR: 71.26 mL/min (ref >60.00)
Glucose, Bld: 93 mg/dL (ref 70–99)
Potassium: 4.2 meq/L (ref 3.5–5.1)
Sodium: 140 meq/L (ref 135–145)
Total Bilirubin: 0.7 mg/dL (ref 0.2–1.2)
Total Protein: 6.7 g/dL (ref 6.0–8.3)

## 2023-10-27 LAB — CBC
HCT: 42 % (ref 36.0–46.0)
Hemoglobin: 14 g/dL (ref 12.0–15.0)
MCHC: 33.2 g/dL (ref 30.0–36.0)
MCV: 89 fl (ref 78.0–100.0)
Platelets: 282 K/uL (ref 150.0–400.0)
RBC: 4.72 Mil/uL (ref 3.87–5.11)
RDW: 13.4 % (ref 11.5–15.5)
WBC: 7.1 K/uL (ref 4.0–10.5)

## 2023-10-27 LAB — LIPID PANEL
Cholesterol: 164 mg/dL (ref 0–200)
HDL: 58.7 mg/dL (ref >39.00)
LDL Cholesterol: 94 mg/dL (ref 0–99)
NonHDL: 105.63
Total CHOL/HDL Ratio: 3
Triglycerides: 56 mg/dL (ref 0.0–149.0)
VLDL: 11.2 mg/dL (ref 0.0–40.0)

## 2023-10-27 LAB — TSH: TSH: 2.15 u[IU]/mL (ref 0.35–5.50)

## 2023-10-27 LAB — VITAMIN D 25 HYDROXY (VIT D DEFICIENCY, FRACTURES): VITD: 50.81 ng/mL (ref 30.00–100.00)

## 2023-10-27 MED ORDER — ATORVASTATIN CALCIUM 10 MG PO TABS: 10.0000 mg | ORAL_TABLET | Freq: Every day | ORAL | 3 refills | Status: AC

## 2023-10-27 MED ORDER — DICLOFENAC SODIUM 75 MG PO TBEC: 75.0000 mg | DELAYED_RELEASE_TABLET | Freq: Two times a day (BID) | ORAL | 3 refills | Status: AC

## 2023-10-27 NOTE — Patient Instructions (Signed)
Return in about 1 year (around 10/27/2024) for Routine chronic condition follow-up-yearly [facility].        Great to see you today.  I have refilled the medication(s) we provide.   If labs were collected or images ordered, we will inform you of  results once we have received them and reviewed. We will contact you either by echart message, or telephone call.  Please give ample time to the testing facility, and our office to run,  receive and review results. Please do not call inquiring of results, even if you can see them in your chart. We will contact you as soon as we are able. If it has been over 1 week since the test was completed, and you have not yet heard from [place], then please call us.    - echart message- for normal results that have been seen by the patient already.   - telephone call: abnormal results or if patient has not viewed results in their echart.  If a referral to a specialist was entered for you, please call us in 2 weeks if you have not heard from the specialist office to schedule.

## 2023-10-28 ENCOUNTER — Telehealth: Payer: Self-pay

## 2023-10-28 ENCOUNTER — Encounter: Payer: Self-pay | Admitting: Family Medicine

## 2023-10-28 NOTE — Telephone Encounter (Signed)
Copied and pasted CRM  Reason for CRM: [facility] from [facility] wanting recent test results faxed as soon as possible for Patient    Fax  [phone]

## 2023-10-28 NOTE — Telephone Encounter (Signed)
LM for pt to return call to discuss.

## 2023-11-01 NOTE — Telephone Encounter (Signed)
Labs printed and given to pt. Labs also faxed via epic to clinic

## 2023-11-01 NOTE — Telephone Encounter (Signed)
Reason for CRM: Patient returning phone call from Erie Noe, states in regard to giving a verbal ok to send her results for her appointment tomorrow at osteosis clinic.

## 2023-11-02 DIAGNOSIS — Z79899 Other long term (current) drug therapy: Secondary | ICD-10-CM | POA: Diagnosis not present

## 2023-11-02 DIAGNOSIS — M810 Age-related osteoporosis without current pathological fracture: Secondary | ICD-10-CM | POA: Diagnosis not present

## 2023-11-02 DIAGNOSIS — E559 Vitamin D deficiency, unspecified: Secondary | ICD-10-CM | POA: Diagnosis not present

## 2023-11-02 DIAGNOSIS — Z5181 Encounter for therapeutic drug level monitoring: Secondary | ICD-10-CM | POA: Diagnosis not present

## 2023-11-03 ENCOUNTER — Encounter: Payer: Self-pay | Admitting: Nurse Practitioner

## 2023-11-03 DIAGNOSIS — M810 Age-related osteoporosis without current pathological fracture: Secondary | ICD-10-CM

## 2023-11-07 ENCOUNTER — Telehealth: Payer: Self-pay

## 2023-11-07 NOTE — Telephone Encounter (Signed)
Copied from CRM #[id number]. Topic: General - Other >> Nov 07, 2023 11:50 AM Florestine Avers wrote: Reason for CRM: Patient called in requesting a call from [facility].

## 2023-11-09 DIAGNOSIS — M810 Age-related osteoporosis without current pathological fracture: Secondary | ICD-10-CM | POA: Diagnosis not present

## 2023-11-09 DIAGNOSIS — E559 Vitamin D deficiency, unspecified: Secondary | ICD-10-CM | POA: Diagnosis not present

## 2023-11-09 DIAGNOSIS — M8588 Other specified disorders of bone density and structure, other site: Secondary | ICD-10-CM | POA: Diagnosis not present

## 2023-11-09 DIAGNOSIS — Z5181 Encounter for therapeutic drug level monitoring: Secondary | ICD-10-CM | POA: Diagnosis not present

## 2023-11-09 DIAGNOSIS — M85852 Other specified disorders of bone density and structure, left thigh: Secondary | ICD-10-CM | POA: Diagnosis not present

## 2023-11-09 DIAGNOSIS — Z79899 Other long term (current) drug therapy: Secondary | ICD-10-CM | POA: Diagnosis not present

## 2023-11-09 NOTE — Telephone Encounter (Signed)
 Have attemted to call patient twice. No voicemail option.

## 2023-11-14 ENCOUNTER — Ambulatory Visit (HOSPITAL_BASED_OUTPATIENT_CLINIC_OR_DEPARTMENT_OTHER)
Admission: RE | Admit: 2023-11-14 | Discharge: 2023-11-14 | Disposition: A | Discharge status: Home / Self Care | Payer: Self-pay | Source: Ambulatory Visit | Attending: Family Medicine | Admitting: Family Medicine

## 2023-11-14 DIAGNOSIS — E785 Hyperlipidemia, unspecified: Secondary | ICD-10-CM | POA: Insufficient documentation

## 2023-12-15 DIAGNOSIS — L821 Other seborrheic keratosis: Secondary | ICD-10-CM | POA: Diagnosis not present

## 2023-12-15 DIAGNOSIS — Z09 Encounter for follow-up examination after completed treatment for conditions other than malignant neoplasm: Secondary | ICD-10-CM | POA: Diagnosis not present

## 2023-12-15 DIAGNOSIS — L814 Other melanin hyperpigmentation: Secondary | ICD-10-CM | POA: Diagnosis not present

## 2023-12-15 DIAGNOSIS — L578 Other skin changes due to chronic exposure to nonionizing radiation: Secondary | ICD-10-CM | POA: Diagnosis not present

## 2023-12-16 ENCOUNTER — Other Ambulatory Visit: Payer: Self-pay

## 2023-12-16 NOTE — Telephone Encounter (Signed)
 RF request for atorvastatin 10mg   LOV: 10/27/23 Next ov: 1 yr CPE  Last written: 10/27/23 (90,3)  Refill denied

## 2024-01-02 DIAGNOSIS — H18413 Arcus senilis, bilateral: Secondary | ICD-10-CM | POA: Diagnosis not present

## 2024-01-02 DIAGNOSIS — H2512 Age-related nuclear cataract, left eye: Secondary | ICD-10-CM | POA: Diagnosis not present

## 2024-01-02 DIAGNOSIS — H2513 Age-related nuclear cataract, bilateral: Secondary | ICD-10-CM | POA: Diagnosis not present

## 2024-01-02 DIAGNOSIS — D3132 Benign neoplasm of left choroid: Secondary | ICD-10-CM | POA: Diagnosis not present

## 2024-01-02 DIAGNOSIS — D3131 Benign neoplasm of right choroid: Secondary | ICD-10-CM | POA: Diagnosis not present

## 2024-01-02 DIAGNOSIS — H353131 Nonexudative age-related macular degeneration, bilateral, early dry stage: Secondary | ICD-10-CM | POA: Diagnosis not present

## 2024-01-02 DIAGNOSIS — H25013 Cortical age-related cataract, bilateral: Secondary | ICD-10-CM | POA: Diagnosis not present

## 2024-01-02 DIAGNOSIS — H25043 Posterior subcapsular polar age-related cataract, bilateral: Secondary | ICD-10-CM | POA: Diagnosis not present

## 2024-03-04 HISTORY — PX: OTHER SURGICAL HISTORY: SHX169

## 2024-03-20 DIAGNOSIS — H25041 Posterior subcapsular polar age-related cataract, right eye: Secondary | ICD-10-CM | POA: Diagnosis not present

## 2024-03-20 DIAGNOSIS — H2511 Age-related nuclear cataract, right eye: Secondary | ICD-10-CM | POA: Diagnosis not present

## 2024-03-20 DIAGNOSIS — H2512 Age-related nuclear cataract, left eye: Secondary | ICD-10-CM | POA: Diagnosis not present

## 2024-03-20 DIAGNOSIS — H25011 Cortical age-related cataract, right eye: Secondary | ICD-10-CM | POA: Diagnosis not present

## 2024-03-20 DIAGNOSIS — E785 Hyperlipidemia, unspecified: Secondary | ICD-10-CM | POA: Diagnosis not present

## 2024-03-20 DIAGNOSIS — H268 Other specified cataract: Secondary | ICD-10-CM | POA: Diagnosis not present

## 2024-03-27 DIAGNOSIS — H2511 Age-related nuclear cataract, right eye: Secondary | ICD-10-CM | POA: Diagnosis not present

## 2024-03-27 DIAGNOSIS — H268 Other specified cataract: Secondary | ICD-10-CM | POA: Diagnosis not present

## 2024-04-26 ENCOUNTER — Telehealth: Payer: Self-pay

## 2024-04-26 NOTE — Telephone Encounter (Signed)
 Pt called stating that she is needing a BMP collected for her osteoporosis clinic. Pt was advised to have them send orders to [facility] or [facility]. Pt is requesting to have labs completed here because it is closer to her. Please advise

## 2024-04-27 DIAGNOSIS — M810 Age-related osteoporosis without current pathological fracture: Secondary | ICD-10-CM | POA: Diagnosis not present

## 2024-04-27 DIAGNOSIS — Z79899 Other long term (current) drug therapy: Secondary | ICD-10-CM | POA: Diagnosis not present

## 2024-04-27 NOTE — Telephone Encounter (Signed)
 Lab requested faxed from [facility] and placed on PCP desk

## 2024-04-27 NOTE — Telephone Encounter (Signed)
 We are not a lab draw station and can perform labs for outside providers  She will need to have completed at lab draw station, if the ordering provider can not have drawn at their location

## 2024-04-30 NOTE — Telephone Encounter (Signed)
 LM for pt to return call to discuss.

## 2024-05-01 NOTE — Telephone Encounter (Signed)
 Copied from CRM [id number]. Topic: General - Other >> Apr 30, 2024  4:56 PM [place] J wrote: Reason for CRM: Patient is calling to follow-up with Shanda. She wanted to give thanks to her for her help and wanted to let her know that she already had her labs drawn yesterday since she did not hear back.

## 2024-05-01 NOTE — Telephone Encounter (Signed)
 No further action needed at this time.

## 2024-05-03 DIAGNOSIS — M810 Age-related osteoporosis without current pathological fracture: Secondary | ICD-10-CM | POA: Diagnosis not present

## 2024-05-03 DIAGNOSIS — Z79899 Other long term (current) drug therapy: Secondary | ICD-10-CM | POA: Diagnosis not present

## 2024-05-03 DIAGNOSIS — Z780 Asymptomatic menopausal state: Secondary | ICD-10-CM | POA: Diagnosis not present

## 2024-05-03 DIAGNOSIS — E559 Vitamin D deficiency, unspecified: Secondary | ICD-10-CM | POA: Diagnosis not present

## 2024-06-25 DIAGNOSIS — D3131 Benign neoplasm of right choroid: Secondary | ICD-10-CM | POA: Diagnosis not present

## 2024-06-25 DIAGNOSIS — H26492 Other secondary cataract, left eye: Secondary | ICD-10-CM | POA: Diagnosis not present

## 2024-06-25 DIAGNOSIS — Z961 Presence of intraocular lens: Secondary | ICD-10-CM | POA: Diagnosis not present

## 2024-06-25 DIAGNOSIS — H26493 Other secondary cataract, bilateral: Secondary | ICD-10-CM | POA: Diagnosis not present

## 2024-06-25 DIAGNOSIS — H18413 Arcus senilis, bilateral: Secondary | ICD-10-CM | POA: Diagnosis not present

## 2024-06-25 DIAGNOSIS — H35413 Lattice degeneration of retina, bilateral: Secondary | ICD-10-CM | POA: Diagnosis not present

## 2024-06-25 DIAGNOSIS — D3132 Benign neoplasm of left choroid: Secondary | ICD-10-CM | POA: Diagnosis not present

## 2024-07-11 DIAGNOSIS — H43393 Other vitreous opacities, bilateral: Secondary | ICD-10-CM | POA: Diagnosis not present

## 2024-07-11 DIAGNOSIS — D3132 Benign neoplasm of left choroid: Secondary | ICD-10-CM | POA: Diagnosis not present

## 2024-07-11 DIAGNOSIS — D3131 Benign neoplasm of right choroid: Secondary | ICD-10-CM | POA: Diagnosis not present

## 2024-07-11 DIAGNOSIS — H43811 Vitreous degeneration, right eye: Secondary | ICD-10-CM | POA: Diagnosis not present

## 2024-07-11 DIAGNOSIS — H3554 Dystrophies primarily involving the retinal pigment epithelium: Secondary | ICD-10-CM | POA: Diagnosis not present

## 2024-07-16 DIAGNOSIS — H26491 Other secondary cataract, right eye: Secondary | ICD-10-CM | POA: Diagnosis not present

## 2024-07-26 ENCOUNTER — Ambulatory Visit (INDEPENDENT_AMBULATORY_CARE_PROVIDER_SITE_OTHER)

## 2024-07-26 DIAGNOSIS — Z23 Encounter for immunization: Secondary | ICD-10-CM

## 2024-08-08 ENCOUNTER — Ambulatory Visit
Admission: RE | Admit: 2024-08-08 | Discharge: 2024-08-08 | Disposition: A | Payer: PRIVATE HEALTH INSURANCE | Source: Ambulatory Visit | Attending: Family Medicine | Admitting: Family Medicine

## 2024-08-08 DIAGNOSIS — Z1231 Encounter for screening mammogram for malignant neoplasm of breast: Secondary | ICD-10-CM | POA: Diagnosis not present

## 2024-08-10 ENCOUNTER — Ambulatory Visit: Payer: Self-pay | Admitting: Family Medicine

## 2024-08-15 ENCOUNTER — Ambulatory Visit: Payer: PRIVATE HEALTH INSURANCE

## 2024-08-20 DIAGNOSIS — L538 Other specified erythematous conditions: Secondary | ICD-10-CM | POA: Diagnosis not present

## 2024-08-20 DIAGNOSIS — L2989 Other pruritus: Secondary | ICD-10-CM | POA: Diagnosis not present

## 2024-08-20 DIAGNOSIS — Z09 Encounter for follow-up examination after completed treatment for conditions other than malignant neoplasm: Secondary | ICD-10-CM | POA: Diagnosis not present

## 2024-08-20 DIAGNOSIS — Z872 Personal history of diseases of the skin and subcutaneous tissue: Secondary | ICD-10-CM | POA: Diagnosis not present

## 2024-08-20 DIAGNOSIS — L820 Inflamed seborrheic keratosis: Secondary | ICD-10-CM | POA: Diagnosis not present

## 2024-08-20 DIAGNOSIS — Z789 Other specified health status: Secondary | ICD-10-CM | POA: Diagnosis not present

## 2024-09-21 ENCOUNTER — Telehealth: Payer: Self-pay

## 2024-09-21 ENCOUNTER — Other Ambulatory Visit (HOSPITAL_COMMUNITY): Payer: Self-pay

## 2024-09-21 MED ORDER — TRAZODONE HCL 50 MG PO TABS
25.0000 mg | ORAL_TABLET | Freq: Every evening | ORAL | 0 refills | Status: AC | PRN
  Filled 2024-09-21 – 2024-10-05 (×2): qty 90, 60d supply, fill #0

## 2024-09-21 NOTE — Addendum Note (Signed)
 Addended by: Rowyn Mustapha A on: 09/21/2024 02:00 PM   Modules accepted: Orders

## 2024-09-21 NOTE — Telephone Encounter (Signed)
 I have refilled the trazodone  per her request.  She did follow-up with this provider yearly to continue her prescriptions provided by this provider. Last office visit with provider 10/27/2023, therefore she is due for an appointment.  It appears she may have a annual wellness visit scheduled with health coach in January.  If she is agreeable, please schedule her an office visit with this provider on the same day that we may manage her chronic conditions and any prescriptions.

## 2024-09-21 NOTE — Telephone Encounter (Signed)
 Communication  Reason for CRM: Patient called in and stated that medication by the name of Trazodone . She stated that the conversation was held with the doctor and she was adv that it would be ordered as a PRN...    Patient wants medication sent to pharmacy    [id number] - [place], [place] - [address]    [address] [place] [address]  Phone: [phone] Fax: [phone]    Last refill was 03/14/23-10/27/23, qty 90 with 5 refills.

## 2024-10-01 ENCOUNTER — Other Ambulatory Visit (HOSPITAL_COMMUNITY): Payer: Self-pay

## 2024-10-05 ENCOUNTER — Other Ambulatory Visit (HOSPITAL_COMMUNITY): Payer: Self-pay

## 2024-10-24 ENCOUNTER — Ambulatory Visit

## 2024-10-24 VITALS — Ht 66.5 in | Wt 136.0 lb

## 2024-10-24 DIAGNOSIS — Z0000 Encounter for general adult medical examination without abnormal findings: Secondary | ICD-10-CM

## 2024-10-24 NOTE — Progress Notes (Signed)
 "  Chief Complaint  Patient presents with   Medicare Wellness     Subjective:   Robin Henry is a 69 y.o. female who presents for a Medicare Annual Wellness Visit.  Visit info / Clinical Intake: Medicare Wellness Visit Type:: Subsequent Annual Wellness Visit Persons participating in visit and providing information:: patient Medicare Wellness Visit Mode:: Telephone If telephone:: video declined Since this visit was completed virtually, some vitals may be partially provided or unavailable. Missing vitals are due to the limitations of the virtual format.: Documented vitals are patient reported If Telephone or Video please confirm:: I connected with patient using audio/video enable telemedicine. I verified patient identity with two identifiers, discussed telehealth limitations, and patient agreed to proceed. Patient Location:: Home Provider Location:: Office Interpreter Needed?: No Pre-visit prep was completed: yes AWV questionnaire completed by patient prior to visit?: no Living arrangements:: lives with spouse/significant other Patient's Overall Health Status Rating: excellent Typical amount of pain: some Does pain affect daily life?: no Are you currently prescribed opioids?: no  Dietary Habits and Nutritional Risks How many meals a day?: 2 (2-3 per pt) Eats fruit and vegetables daily?: yes Most meals are obtained by: eating out In the last 2 weeks, have you had any of the following?: none Diabetic:: no  Functional Status Activities of Daily Living (to include ambulation/medication): Independent Ambulation: Independent Medication Administration: Independent Home Management (perform basic housework or laundry): Independent Manage your own finances?: yes Primary transportation is: driving Concerns about vision?: no *vision screening is required for [facility]* Concerns about hearing?: no  Fall Screening Falls in the past year?: 0 Number of falls in past year: 0 Was there an  injury with Fall?: 0 Fall Risk Category Calculator: 0 Patient Fall Risk Level: Low Fall Risk  Fall Risk Patient at Risk for Falls Due to: No Fall Risks Fall risk Follow up: Falls evaluation completed; Falls prevention discussed  Home and Transportation Safety: All rugs have non-skid backing?: N/A, no rugs All stairs or steps have railings?: yes Grab bars in the bathtub or shower?: (!) no Have non-skid surface in bathtub or shower?: yes Good home lighting?: yes Regular seat belt use?: yes Hospital stays in the last year:: no  Cognitive Assessment Difficulty concentrating, remembering, or making decisions? : no Will 6CIT or Mini Cog be Completed: no 6CIT or Mini Cog Declined: patient alert, oriented, able to answer questions appropriately and recall recent events  Advance Directives (For Healthcare) Does Patient Have a Medical Advance Directive?: No  Reviewed/Updated  Reviewed/Updated: Reviewed All (Medical, Surgical, Family, Medications, Allergies, Care Teams, Patient Goals)    Allergies (verified) Levaquin  [levofloxacin ]   Current Medications (verified) Outpatient Encounter Medications as of 10/24/2024  Medication Sig   atorvastatin  (LIPITOR) 10 MG tablet Take 1 tablet (10 mg total) by mouth daily.   Calcium  Carbonate-Vit D-Min (CALCIUM  1200 PO) Take by mouth 2 (two) times daily.   diclofenac  (VOLTAREN ) 75 MG EC tablet Take 1 tablet (75 mg total) by mouth 2 (two) times daily.   fish oil-omega-3 fatty acids 1000 MG capsule Take 2 g by mouth daily.   fluorouracil (EFUDEX) 5 % cream Apply 1 Application topically 2 (two) times daily.   Magnesium 400 MG TABS Take by mouth.   Multiple Vitamins-Minerals (PRESERVISION AREDS 2) CAPS Take 1 softgel in the morning and 1 in the evening with food   Probiotic Product (ALIGN PO) Take by mouth.   traZODone  (DESYREL ) 50 MG tablet Take 1/2 - 1 and 1/2 tablets (25 -  75 mg total) by mouth at bedtime as needed for sleep. Office visit required  fro refills.   Vitamin D , Cholecalciferol, 10 MCG (400 UNIT) CAPS Take 5,000 Units by mouth.   bimatoprost (LATISSE) 0.03 % ophthalmic solution APPLY TO UPPER LASHES DAILY FOR 4 WEEKS THEN 3 TIMES PER WEEK (Patient not taking: Reported on 10/24/2024)   vitamin E 180 MG (400 UNITS) capsule Take 400 Units by mouth daily. (Patient not taking: Reported on 10/24/2024)   No facility-administered encounter medications on file as of 10/24/2024.    History: Past Medical History:  Diagnosis Date   Chronic paronychia of finger of left hand 10/10/2018   Foot cramps 02/06/2019   Gastroesophageal reflux disease 02/06/2019   Hoarseness 2018   with cough; laryngoscopy by Dr. Mable showed all normal except postglottic erythema c/w reflux.  Pt started on PPI q 5 PM.  If cough persists, consider referral to [facility] cough [facility].   Laryngopharyngeal reflux (LPR) 11/2016   Macular dystrophy    Hx of detached retina (Dr. Jarold)   Osteoarthritis, multiple sites    Primarily knees   Skin lesion 03/09/2022   Tinea corporis 02/06/2019   Past Surgical History:  Procedure Laterality Date   ABDOMINAL HYSTERECTOMY  2001   fibroids   COLONOSCOPY  09/08/2012   Normal.  Recall 10 yrs.  Procedure: COLONOSCOPY;  Surgeon: Oliva FORBES Boots, MD;  Location: [facility];  Service: Endoscopy;  Laterality: N/A;  wants no sedation-but start iv   UPPER GASTROINTESTINAL ENDOSCOPY  04/17/2018   neg H.Pylori, terminal ileum eroded chronic active ileitis negative for dysplasia, CMV neg.    Family History  Problem Relation Age of Onset   Hypertension Mother    Emphysema Mother    Cancer Mother        cartoid   Alcohol abuse Sister    Alcohol abuse Maternal Grandmother    Lung cancer Maternal Grandmother    Colon cancer Paternal Grandfather    Breast cancer Neg Hx    Social History   Occupational History   Occupation: Semi Retired  Tobacco Use   Smoking status: Never    Passive exposure: Never   Smokeless tobacco:  Never  Vaping Use   Vaping status: Never Used  Substance and Sexual Activity   Alcohol use: Yes    Comment: rare   Drug use: No   Sexual activity: Yes   Tobacco Counseling Counseling given: Not Answered  SDOH Screenings   Food Insecurity: No Food Insecurity (10/24/2024)  Housing: Unknown (10/24/2024)  Transportation Needs: No Transportation Needs (10/24/2024)  Utilities: Not At Risk (10/24/2024)  Alcohol Screen: Low Risk (06/29/2023)  Depression (PHQ2-9): Low Risk (10/24/2024)  Financial Resource Strain: Low Risk (06/29/2023)  Physical Activity: Sufficiently Active (10/24/2024)  Social Connections: Moderately Isolated (10/24/2024)  Stress: No Stress Concern Present (10/24/2024)  Tobacco Use: Low Risk (10/24/2024)  Health Literacy: Adequate Health Literacy (10/24/2024)   See flowsheets for full screening details  Depression Screen PHQ 2 & 9 Depression Scale- Over the past 2 weeks, how often have you been bothered by any of the following problems? Little interest or pleasure in doing things: 0 Feeling down, depressed, or hopeless (PHQ Adolescent also includes...irritable): 0 PHQ-2 Total Score: 0 Trouble falling or staying asleep, or sleeping too much: 3 (staying asleep) Feeling tired or having little energy: 0 Poor appetite or overeating (PHQ Adolescent also includes...weight loss): 0 Feeling bad about yourself - or that you are a failure or have let yourself or your  family down: 0 Trouble concentrating on things, such as reading the newspaper or watching television [facility] Adolescent also includes...like school work): 0 Moving or speaking so slowly that other people could have noticed. Or the opposite - being so fidgety or restless that you have been moving around a lot more than usual: 0 Thoughts that you would be better off dead, or of hurting yourself in some way: 0 PHQ-9 Total Score: 3 If you checked off any problems, how difficult have these problems made it for you to do your work,  take care of things at home, or get along with other people?: Not difficult at all  Depression Treatment Depression Interventions/Treatment : EYV7-0 Score <4 Follow-up Not Indicated     Goals Addressed               This Visit's Progress     Patient Stated (pt-stated)        Remain active, gain muscle mass, flexibly/2026             Objective:    Today's Vitals   10/24/24 0916  Weight: 136 lb (61.7 kg)  Height: 5' 6.5 (1.689 m)   Body mass index is 21.62 kg/m.  Hearing/Vision screen Hearing Screening - Comments:: Denies hearing difficulties   Immunizations and Health Maintenance Health Maintenance  Topic Date Due   Bone Density Scan  11/25/2023   DTaP/Tdap/Td (2 - Td or Tdap) 04/24/2025   Mammogram  08/08/2025   Medicare Annual Wellness (AWV)  10/24/2025   Colonoscopy  04/13/2028   Pneumococcal Vaccine: 50+ Years  Completed   Influenza Vaccine  Completed   Hepatitis C Screening  Completed   Meningococcal B Vaccine  Aged Out   COVID-19 Vaccine  Discontinued   Zoster Vaccines- Shingrix  Discontinued        Assessment/Plan:  This is a routine wellness examination for Jaemarie.  Patient Care Team: Catherine Charlies LABOR, DO as PCP - General (Family Medicine) Mable Lenis, MD (Inactive) as Consulting Physician (Otolaryngology) Rosalie Kitchens, MD as Consulting Physician (Gastroenterology) Claudene Kate HERO, NP-C (Gynecology) Glendia Simmonds, OD as Referring Physician (Optometry) Hollar, Lahoma Greener, MD as Referring Physician (Dermatology) Jarold Mayo, MD as Consulting Physician (Ophthalmology) [facility]  I have personally reviewed and noted the following in the patients chart:   Medical and social history Use of alcohol, tobacco or illicit drugs  Current medications and supplements including opioid prescriptions. Functional ability and status Nutritional status Physical activity Advanced directives List of other physicians Hospitalizations,  surgeries, and ER visits in previous 12 months Vitals Screenings to include cognitive, depression, and falls Referrals and appointments  No orders of the defined types were placed in this encounter.  In addition, I have reviewed and discussed with patient certain preventive protocols, quality metrics, and best practice recommendations. A written personalized care plan for preventive services as well as general preventive health recommendations were provided to patient.   Hristopher Missildine L Draper Gallon, CMA   10/24/2024   Return in 1 year (on 10/24/2025).  After Visit Summary: (MyChart) Due to this being a telephonic visit, the after visit summary with patients personalized plan was offered to patient via MyChart   Nurse Notes: Patient is due for a DEXA.  She will schedule a wellness visit with provider and discuss the DEXA at that time.  Order has not been placed today.  Patient had no other concerns to address today. "

## 2024-10-24 NOTE — Patient Instructions (Signed)
 Robin Henry,  Thank you for taking the time for your Medicare Wellness Visit. I appreciate your continued commitment to your health goals. Please review the care plan we discussed, and feel free to reach out if I can assist you further.  Please note that Annual Wellness Visits do not include a physical exam. Some assessments may be limited, especially if the visit was conducted virtually. If needed, we may recommend an in-person follow-up with your provider.  Ongoing Care Seeing your primary care provider every 3 to 6 months helps us  monitor your health and provide consistent, personalized care. Last office visit on 10/27/2023.  You are due for a bone density screening.  Please schedule an office visit to discuss with provider.    Referrals If a referral was made during today's visit and you haven't received any updates within two weeks, please contact the referred provider directly to check on the status.  Recommended Screenings:  Health Maintenance  Topic Date Due   Osteoporosis screening with Bone Density Scan  11/25/2023   Medicare Annual Wellness Visit  06/28/2024   DTaP/Tdap/Td vaccine (2 - Td or Tdap) 04/24/2025   Breast Cancer Screening  08/08/2025   Colon Cancer Screening  04/13/2028   Pneumococcal Vaccine for age over 4  Completed   Flu Shot  Completed   Hepatitis C Screening  Completed   Meningitis B Vaccine  Aged Out   COVID-19 Vaccine  Discontinued   Zoster (Shingles) Vaccine  Discontinued       10/24/2024    9:22 AM  Advanced Directives  Does Patient Have a Medical Advance Directive? No    Vision: Annual vision screenings are recommended for early detection of glaucoma, cataracts, and diabetic retinopathy. These exams can also reveal signs of chronic conditions such as diabetes and high blood pressure.  Dental: Annual dental screenings help detect early signs of oral cancer, gum disease, and other conditions linked to overall health, including heart disease and  diabetes.  Please see the attached documents for additional preventive care recommendations.

## 2024-12-12 ENCOUNTER — Encounter: Admitting: Family Medicine
